# Patient Record
Sex: Female | Born: 1975
Health system: Southern US, Community
[De-identification: ages and names within clinical notes are randomized; demographics above are authoritative.]

## PROBLEM LIST (undated history)

## (undated) DIAGNOSIS — T7840XA Allergy, unspecified, initial encounter: Secondary | ICD-10-CM

## (undated) HISTORY — DX: Allergy, unspecified, initial encounter: T78.40XA

---

## 2003-12-14 ENCOUNTER — Other Ambulatory Visit: Admission: RE | Admit: 2003-12-14 | Discharge: 2003-12-14 | Payer: Self-pay | Admitting: Obstetrics & Gynecology

## 2004-04-16 ENCOUNTER — Ambulatory Visit (HOSPITAL_COMMUNITY): Admission: RE | Admit: 2004-04-16 | Discharge: 2004-04-16 | Payer: Self-pay | Admitting: *Deleted

## 2004-04-17 ENCOUNTER — Inpatient Hospital Stay (HOSPITAL_COMMUNITY): Admission: AD | Admit: 2004-04-17 | Discharge: 2004-04-17 | Payer: Self-pay | Admitting: Obstetrics and Gynecology

## 2004-04-20 ENCOUNTER — Inpatient Hospital Stay (HOSPITAL_COMMUNITY): Admission: AD | Admit: 2004-04-20 | Discharge: 2004-04-20 | Payer: Self-pay | Admitting: Obstetrics and Gynecology

## 2004-04-23 ENCOUNTER — Inpatient Hospital Stay (HOSPITAL_COMMUNITY): Admission: AD | Admit: 2004-04-23 | Discharge: 2004-04-23 | Payer: Self-pay | Admitting: Obstetrics & Gynecology

## 2004-04-30 ENCOUNTER — Inpatient Hospital Stay (HOSPITAL_COMMUNITY): Admission: AD | Admit: 2004-04-30 | Discharge: 2004-04-30 | Payer: Self-pay | Admitting: Obstetrics & Gynecology

## 2004-05-10 ENCOUNTER — Inpatient Hospital Stay (HOSPITAL_COMMUNITY): Admission: AD | Admit: 2004-05-10 | Discharge: 2004-05-10 | Payer: Self-pay | Admitting: Obstetrics & Gynecology

## 2004-05-13 ENCOUNTER — Inpatient Hospital Stay (HOSPITAL_COMMUNITY): Admission: AD | Admit: 2004-05-13 | Discharge: 2004-05-13 | Payer: Self-pay | Admitting: Obstetrics and Gynecology

## 2004-05-16 ENCOUNTER — Inpatient Hospital Stay (HOSPITAL_COMMUNITY): Admission: AD | Admit: 2004-05-16 | Discharge: 2004-05-16 | Payer: Self-pay | Admitting: *Deleted

## 2004-05-23 ENCOUNTER — Inpatient Hospital Stay (HOSPITAL_COMMUNITY): Admission: AD | Admit: 2004-05-23 | Discharge: 2004-05-23 | Payer: Self-pay | Admitting: Obstetrics and Gynecology

## 2004-05-30 ENCOUNTER — Inpatient Hospital Stay (HOSPITAL_COMMUNITY): Admission: AD | Admit: 2004-05-30 | Discharge: 2004-05-30 | Payer: Self-pay | Admitting: *Deleted

## 2004-06-06 ENCOUNTER — Inpatient Hospital Stay (HOSPITAL_COMMUNITY): Admission: AD | Admit: 2004-06-06 | Discharge: 2004-06-06 | Payer: Self-pay | Admitting: Obstetrics and Gynecology

## 2004-06-13 ENCOUNTER — Inpatient Hospital Stay (HOSPITAL_COMMUNITY): Admission: AD | Admit: 2004-06-13 | Discharge: 2004-06-13 | Payer: Self-pay | Admitting: Obstetrics and Gynecology

## 2004-07-04 ENCOUNTER — Ambulatory Visit (HOSPITAL_COMMUNITY): Admission: RE | Admit: 2004-07-04 | Discharge: 2004-07-04 | Payer: Self-pay | Admitting: *Deleted

## 2004-08-10 ENCOUNTER — Inpatient Hospital Stay (HOSPITAL_COMMUNITY): Admission: AD | Admit: 2004-08-10 | Discharge: 2004-08-10 | Payer: Self-pay | Admitting: Obstetrics and Gynecology

## 2004-08-12 ENCOUNTER — Inpatient Hospital Stay (HOSPITAL_COMMUNITY): Admission: AD | Admit: 2004-08-12 | Discharge: 2004-08-12 | Payer: Self-pay | Admitting: Obstetrics & Gynecology

## 2004-11-04 ENCOUNTER — Inpatient Hospital Stay (HOSPITAL_COMMUNITY): Admission: AD | Admit: 2004-11-04 | Discharge: 2004-11-04 | Payer: Self-pay | Admitting: Obstetrics and Gynecology

## 2005-06-08 ENCOUNTER — Inpatient Hospital Stay (HOSPITAL_COMMUNITY): Admission: AD | Admit: 2005-06-08 | Discharge: 2005-06-10 | Payer: Self-pay | Admitting: Obstetrics and Gynecology

## 2006-08-21 ENCOUNTER — Inpatient Hospital Stay (HOSPITAL_COMMUNITY): Admission: AD | Admit: 2006-08-21 | Discharge: 2006-08-23 | Payer: Self-pay | Admitting: Obstetrics and Gynecology

## 2011-01-16 ENCOUNTER — Emergency Department (HOSPITAL_BASED_OUTPATIENT_CLINIC_OR_DEPARTMENT_OTHER)
Admission: EM | Admit: 2011-01-16 | Discharge: 2011-01-16 | Disposition: A | Discharge status: Home / Self Care | Payer: BC Managed Care – PPO | Attending: Emergency Medicine | Admitting: Emergency Medicine

## 2011-01-16 ENCOUNTER — Emergency Department (INDEPENDENT_AMBULATORY_CARE_PROVIDER_SITE_OTHER): Payer: BC Managed Care – PPO

## 2011-01-16 DIAGNOSIS — R05 Cough: Secondary | ICD-10-CM

## 2011-01-16 DIAGNOSIS — Z79899 Other long term (current) drug therapy: Secondary | ICD-10-CM | POA: Insufficient documentation

## 2011-01-16 DIAGNOSIS — R059 Cough, unspecified: Secondary | ICD-10-CM | POA: Insufficient documentation

## 2011-08-01 ENCOUNTER — Ambulatory Visit: Payer: BC Managed Care – PPO | Admitting: Family Medicine

## 2011-11-06 ENCOUNTER — Ambulatory Visit: Payer: BC Managed Care – PPO | Admitting: Family Medicine

## 2011-11-10 ENCOUNTER — Ambulatory Visit (INDEPENDENT_AMBULATORY_CARE_PROVIDER_SITE_OTHER): Payer: BC Managed Care – PPO | Admitting: Family Medicine

## 2011-11-10 ENCOUNTER — Encounter: Payer: Self-pay | Admitting: Family Medicine

## 2011-11-10 DIAGNOSIS — J309 Allergic rhinitis, unspecified: Secondary | ICD-10-CM

## 2011-11-10 MED ORDER — FLUTICASONE FUROATE 27.5 MCG/SPRAY NA SUSP: 2.0000 | Freq: Every day | NASAL | Status: DC

## 2011-11-10 NOTE — Progress Notes (Signed)
  Subjective:    Patient ID: Debbie Hammond, female    DOB: September 22, 1976, 35 y.o.   MRN: [id number]  HPI New to establish.  No previous MD.  [facility] Bibles[facility] [place], Dr Seymour Bars  Allergies- chronic problem, saw allergist in 2010.  Was tested for asthma- none found.  Allergic to [facility] and grasses.  Taking Claritin daily and singulair as needed.  Has nasal spray- can't remember the name (Veramyst upon questioning)  Rarely using albuterol inhaler- last used in May.  Current cough and runny nose.  Denies feeling ill.   Review of Systems For ROS see HPI     Objective:   Physical Exam  Vitals reviewed. Constitutional: [name]. No distress.  HENT:  Head: Normocephalic and atraumatic.  Right Ear: Tympanic membrane normal.  Left Ear: Tympanic membrane normal.  Nose: Mucosal edema and rhinorrhea present. Right sinus exhibits no maxillary sinus tenderness and no frontal sinus tenderness. Left sinus exhibits no maxillary sinus tenderness and no frontal sinus tenderness.  Mouth/Throat: Mucous membranes are normal. Posterior oropharyngeal erythema (w/ PND) present.  Eyes: Conjunctivae and EOM are normal. Pupils are equal, round, and reactive to light.  Neck: Normal range of motion. Neck supple.  Cardiovascular: Normal rate, regular rhythm and normal heart sounds.   Pulmonary/Chest: Effort normal and breath sounds normal. No respiratory distress. She has no wheezes. She has no rales.  Lymphadenopathy:    She has no cervical adenopathy.          Assessment & Plan:

## 2011-11-10 NOTE — Patient Instructions (Signed)
Schedule your complete physical at your convenience- don't eat before this appt Continue the Claritin daily Add the Veramyst spray- 2 sprays each nostril daily Call with any questions or concerns Welcome!  We're glad to have you! Happy Holidays!!

## 2011-11-23 DIAGNOSIS — J309 Allergic rhinitis, unspecified: Secondary | ICD-10-CM | POA: Insufficient documentation

## 2011-11-23 NOTE — Assessment & Plan Note (Signed)
Chronic problem.  Encouraged continued use of OTC antihistamine and regular use of nasal steroid.  Reviewed supportive care and red flags that should prompt return.  Pt expressed understanding and is in agreement w/ plan.

## 2012-01-19 ENCOUNTER — Ambulatory Visit (INDEPENDENT_AMBULATORY_CARE_PROVIDER_SITE_OTHER): Payer: BC Managed Care – PPO | Admitting: Internal Medicine

## 2012-01-19 ENCOUNTER — Encounter: Payer: Self-pay | Admitting: *Deleted

## 2012-01-19 ENCOUNTER — Ambulatory Visit (INDEPENDENT_AMBULATORY_CARE_PROVIDER_SITE_OTHER)
Admission: RE | Admit: 2012-01-19 | Discharge: 2012-01-19 | Disposition: A | Discharge status: Home / Self Care | Payer: BC Managed Care – PPO | Source: Ambulatory Visit | Attending: Internal Medicine | Admitting: Internal Medicine

## 2012-01-19 VITALS — BP 102/64 | HR 105 | Temp 97.7°F | Wt 120.0 lb

## 2012-01-19 DIAGNOSIS — M549 Dorsalgia, unspecified: Secondary | ICD-10-CM

## 2012-01-19 LAB — CBC WITH DIFFERENTIAL/PLATELET
Basophils Absolute: 0 10*3/uL (ref 0.0–0.1)
HCT: 39.1 % (ref 36.0–46.0)
Hemoglobin: 13 g/dL (ref 12.0–15.0)
Lymphocytes Relative: 28.8 % (ref 12.0–46.0)
Lymphs Abs: 1.3 10*3/uL (ref 0.7–4.0)
MCV: 92.2 fl (ref 78.0–100.0)
Neutrophils Relative %: 51.3 % (ref 43.0–77.0)
RDW: 12.6 % (ref 11.5–14.6)

## 2012-01-19 LAB — SEDIMENTATION RATE: Sed Rate: 9 mm/hr (ref 0–22)

## 2012-01-19 MED ORDER — HYDROCODONE-ACETAMINOPHEN 5-500 MG PO TABS: 1.0000 | ORAL_TABLET | ORAL | Status: AC | PRN

## 2012-01-19 MED ORDER — CYCLOBENZAPRINE HCL 10 MG PO TABS: 10.0000 mg | ORAL_TABLET | Freq: Every evening | ORAL | Status: AC | PRN

## 2012-01-19 NOTE — Assessment & Plan Note (Signed)
On and off back pain for 6 months, this episode is lasting longer than usual. She is slightly tender to palpation and a distal T-spine. Plan: CBC, sed rate X-ray She is allergic to aspirin, takes Motrin from time to time but it causes GI side effects consequently will prescribe hydrocodone, Flexeril. See  instructions, if not better will need a referral

## 2012-01-19 NOTE — Progress Notes (Signed)
  Subjective:    Patient ID: Debbie Hammond, female    DOB: 1976-09-03, 35 y.o.   MRN: [id number]  HPI Acute vist 6 months history of on and off low back pain without radiation, symptoms usually last a day or 2, this episode started 2 days ago and is not improving. Pain is usually triggered by minor home chores, she does not do heavy lifting at home or at work. The pain does not radiate, she usually takes one or 2 Motrin with some help but admits that it caused some stomach discomfort.  Past Medical History  Diagnosis Date  . Allergy   . Asthma      Review of Systems No fever or chills No lower extremity paresthesias No actual injuries. No rash anywhere. No overt extremity edema. Does her birth control with an IUD    Objective:   Physical Exam  Constitutional: She is oriented to person, place, and time. [name]. No distress.  Abdominal: Soft. Bowel sounds are normal. She exhibits no distension. There is no tenderness. There is no rebound and no guarding.  Musculoskeletal: She exhibits no edema.       Legs: Neurological: [name], place, and time.       She does have an antalgic position when she lays down. Neurological exam is otherwise normal, no motor deficits, and DTRs normal. Straight leg test negative.  Skin: She is not diaphoretic.  Psychiatric: She has a normal mood and affect. Her behavior is normal. Judgment and thought content normal.       Assessment & Plan:

## 2012-01-19 NOTE — Patient Instructions (Signed)
No heavy lifting Warm compresses For pain, avoid Motrin, take Tylenol, you can also take the prescribed medications Flexeril and hydrocodone; keep in mind hydrocodone has tylenol in it already. Both meds  will make you sleepy Call if no better in 1 week Get the XR

## 2012-01-20 ENCOUNTER — Encounter: Payer: Self-pay | Admitting: Internal Medicine

## 2012-01-20 ENCOUNTER — Telehealth: Payer: Self-pay | Admitting: Family Medicine

## 2012-01-20 NOTE — Telephone Encounter (Signed)
Please advise on Xray results.

## 2012-01-20 NOTE — Telephone Encounter (Signed)
Advise patient: XR ok, plan is the same

## 2012-01-20 NOTE — Telephone Encounter (Signed)
Discuss with patient. Pt would now like to know result of labs.

## 2012-01-20 NOTE — Telephone Encounter (Signed)
Spoke with pt gave lab results

## 2012-01-20 NOTE — Telephone Encounter (Signed)
Patient is calling to get results from xray taken yesterday. Call [phone]

## 2012-01-20 NOTE — Telephone Encounter (Signed)
Labs were normal

## 2012-02-16 ENCOUNTER — Encounter: Payer: Self-pay | Admitting: Family Medicine

## 2012-02-16 ENCOUNTER — Ambulatory Visit: Payer: BC Managed Care – PPO | Admitting: Family Medicine

## 2012-02-16 ENCOUNTER — Ambulatory Visit (INDEPENDENT_AMBULATORY_CARE_PROVIDER_SITE_OTHER): Payer: BC Managed Care – PPO | Admitting: Family Medicine

## 2012-02-16 VITALS — BP 115/75 | HR 88 | Temp 98.5°F | Ht 70.25 in | Wt 122.8 lb

## 2012-02-16 DIAGNOSIS — K644 Residual hemorrhoidal skin tags: Secondary | ICD-10-CM

## 2012-02-16 DIAGNOSIS — L309 Dermatitis, unspecified: Secondary | ICD-10-CM

## 2012-02-16 DIAGNOSIS — M549 Dorsalgia, unspecified: Secondary | ICD-10-CM

## 2012-02-16 DIAGNOSIS — L259 Unspecified contact dermatitis, unspecified cause: Secondary | ICD-10-CM

## 2012-02-16 HISTORY — DX: Residual hemorrhoidal skin tags: K64.4

## 2012-02-16 MED ORDER — HYDROCORTISONE 2.5 % EX OINT: TOPICAL_OINTMENT | Freq: Two times a day (BID) | CUTANEOUS | Status: AC

## 2012-02-16 MED ORDER — PREDNISONE 20 MG PO TABS: ORAL_TABLET | ORAL | Status: DC

## 2012-02-16 NOTE — Progress Notes (Signed)
  Subjective:    Patient ID: Debbie Hammond, female    DOB: [birth year], 36 y.o.   MRN: [id number]  HPI Back pain- pt saw Dr Drue Novel on 3/4 and was treated w/ flexeril and hydrocodone.  Pt reports sxs have somewhat improved.  Stopped the meds b/c they made her sleepy.  Pain is lower back, bilateral.  Able to 'do everything' but difficulty w/ getting up from sitting or lying.  Difficulty picking up something when dropped/bending down.  Using heating pad w/ some relief.  No weakness or numbness of legs.  Pain does not radiate into legs.  Recurrent hemorrhoids- first occurred w/ 2nd pregnancy, recently recurred.  Using Tucks pads w/ some relief.  Interested in tx.  Eczema- pt w/ hyperpigmented patches on neck, occasionally itchy.  Using OTC hydrocortisone cream w/ some relief but continues to have new areas arise.  Review of Systems For ROS see HPI     Objective:   Physical Exam  Vitals reviewed. Constitutional: [name]. No distress.  Musculoskeletal: Normal range of motion. She exhibits tenderness (over bilateral SI joints). She exhibits no edema.       (-) SLR bilaterally Good flexion and extension of back  Neurological: She has normal reflexes. Coordination normal.  Skin: Skin is warm and dry. Rash (hyperpigmented eczematous areas on neck) noted.          Assessment & Plan:

## 2012-02-16 NOTE — Patient Instructions (Signed)
Start the Prednisone- 2 tabs at the same time w/ food for the back pain Continue the heating pad If no improvement in 1 week- call me and we'll refer you to Sports Medicine Someone will call you with your surgery appt for the hemorrhoid Continue the hydrocortisone cream on the eczema Call with any questions or concerns Hang in there!!!

## 2012-02-17 DIAGNOSIS — L309 Dermatitis, unspecified: Secondary | ICD-10-CM | POA: Insufficient documentation

## 2012-02-17 NOTE — Assessment & Plan Note (Signed)
New.  sxs not responding to OTC hydrocortisone cream.  Increase to 2.5% BID prn.  Pt expressed understanding and is in agreement w/ plan.

## 2012-02-17 NOTE — Assessment & Plan Note (Addendum)
New.  Pt would like referral for definitive tx due to recurrence.  Referral to surgery made.

## 2012-02-17 NOTE — Assessment & Plan Note (Signed)
Improving.  Pt w/ SI joint pain.  Had rxn to ASA so will avoid typical NSAIDs but will start Prednisone and see if sxs improve.  If not, will refer to ortho.  Reviewed supportive care and red flags that should prompt return.  Pt expressed understanding and is in agreement w/ plan.

## 2012-03-08 ENCOUNTER — Ambulatory Visit (INDEPENDENT_AMBULATORY_CARE_PROVIDER_SITE_OTHER): Payer: BC Managed Care – PPO | Admitting: General Surgery

## 2012-03-16 ENCOUNTER — Telehealth: Payer: Self-pay | Admitting: Family Medicine

## 2012-03-16 NOTE — Telephone Encounter (Signed)
Patient called back and now wants to know if she can have the IUD she has removed and another one inserted? She still has the paperwork from the original IUD and states she can bring it with her. Should she go back to her GYN that originally put it in or can she keep her appointment on 5.20.13 and you do both? Please advise  Thanks

## 2012-03-16 NOTE — Telephone Encounter (Signed)
Pt can have IUD removed at time of CPE/pap but there would be no time to address additional concerns at the physical.  If she would like to come in prior to 5/22 for IUD removal it could be squeezed into a 15 minute slot but that would be the only thing done that day.

## 2012-03-16 NOTE — Telephone Encounter (Signed)
Will need to see GYN for IUD insertion (we order these on an individual basis and do not have one in stock).  Based on fact she wants another IUD placed, she should go to GYN for removal and subsequent insertion of replacement

## 2012-03-16 NOTE — Telephone Encounter (Signed)
Patient states she can not miss to much work for appointments and she needs to work to pay for her insurance and her co-pay. She is requesting a physical along with her IUD removal. I tried to explain to her both take at least 30-minutes and I did not have time on the schedule to do both in May. I did schedule her for a 30-minute OV 5.20.13 for her IUD removal and explained to her that was all I could at this time but I would speak with the Doctor about it and advise later today what we could or could not do. Patient states her IUD must be out before 5.22.13 and she does not want to wait any longer than that and that she doesn't want to come back and pay another co-pay or miss work for a Physical. Please review and advise Patient PH# [phone]

## 2012-03-16 NOTE — Telephone Encounter (Signed)
Advised patient via phone, she clearly understood & has decided to make them separate appointments.

## 2012-03-16 NOTE — Telephone Encounter (Signed)
Spoke with MD Beverely Low concerning the IUD and CPE, pt can have this apt combined but not be able to discuss another other concerns curing the CPE per time to do both will not allow for other discussion, MD Beverely Low advised that removal of the IUD after 04-07-12 is not crucial, this just means the IUD will no longer protect against pregnancy, please advise pt

## 2012-03-17 NOTE — Telephone Encounter (Signed)
Spoke to patient she understood completely was able to get appointment with GYN & was happy

## 2012-03-17 NOTE — Telephone Encounter (Signed)
Great!

## 2012-04-05 ENCOUNTER — Ambulatory Visit: Payer: BC Managed Care – PPO | Admitting: Family Medicine

## 2012-04-26 ENCOUNTER — Telehealth: Payer: Self-pay | Admitting: Family Medicine

## 2012-04-26 DIAGNOSIS — L309 Dermatitis, unspecified: Secondary | ICD-10-CM

## 2012-04-26 NOTE — Telephone Encounter (Addendum)
Called pt to clarify where the rash is and how it looks, etc per noted eczema from the 4-13 OV, in order to place referral in chart need to have clarified, unable to leave message on home phone, will call again later, called pt and was advised yes the same rash noted as eczema, advised that a referral has been sent and someone will call her with the apt once it is available.

## 2012-04-26 NOTE — Telephone Encounter (Signed)
Pt states her rash has spread and would like Dr. Beverely Low to refer her to a dermatologist. Pt states she does not want to come back into the office to see Dr. Beverely Low about the same thing.

## 2014-08-17 LAB — HM MAMMOGRAPHY

## 2015-10-22 ENCOUNTER — Telehealth: Payer: Self-pay | Admitting: Behavioral Health

## 2015-10-22 ENCOUNTER — Encounter: Payer: Self-pay | Admitting: Behavioral Health

## 2015-10-22 NOTE — Telephone Encounter (Signed)
Pre-Visit Call completed with patient and chart updated.   Pre-Visit Info documented in Specialty Comments under [employer].    

## 2015-10-23 ENCOUNTER — Encounter: Payer: Self-pay | Admitting: Family

## 2015-10-23 ENCOUNTER — Ambulatory Visit (INDEPENDENT_AMBULATORY_CARE_PROVIDER_SITE_OTHER): Payer: BLUE CROSS/BLUE SHIELD | Admitting: Family

## 2015-10-23 ENCOUNTER — Other Ambulatory Visit (HOSPITAL_COMMUNITY)
Admission: RE | Admit: 2015-10-23 | Discharge: 2015-10-23 | Disposition: A | Discharge status: Home / Self Care | Payer: BLUE CROSS/BLUE SHIELD | Source: Ambulatory Visit | Attending: Family | Admitting: Family

## 2015-10-23 VITALS — BP 113/71 | HR 72 | Temp 98.4°F | Resp 16 | Ht 69.5 in | Wt 124.0 lb

## 2015-10-23 DIAGNOSIS — M25551 Pain in right hip: Secondary | ICD-10-CM | POA: Diagnosis not present

## 2015-10-23 DIAGNOSIS — Z113 Encounter for screening for infections with a predominantly sexual mode of transmission: Secondary | ICD-10-CM | POA: Insufficient documentation

## 2015-10-23 DIAGNOSIS — Z23 Encounter for immunization: Secondary | ICD-10-CM

## 2015-10-23 DIAGNOSIS — J309 Allergic rhinitis, unspecified: Secondary | ICD-10-CM

## 2015-10-23 DIAGNOSIS — R0789 Other chest pain: Secondary | ICD-10-CM

## 2015-10-23 DIAGNOSIS — N644 Mastodynia: Secondary | ICD-10-CM | POA: Diagnosis not present

## 2015-10-23 LAB — POCT URINALYSIS DIPSTICK
BILIRUBIN UA: NEGATIVE
GLUCOSE UA: NEGATIVE
Ketones, UA: NEGATIVE
Leukocytes, UA: NEGATIVE
Nitrite, UA: NEGATIVE
RBC UA: NEGATIVE
SPEC GRAV UA: 1.02
Urobilinogen, UA: NEGATIVE
pH, UA: 6.5

## 2015-10-23 NOTE — Patient Instructions (Signed)
Try to avoid/limit caffeine intake. You will be contacted about scheduling your ultrasound. You may use tylenol or motrin as needed (sparingly) for left breast/shoulder pain. Please schedule complete physical at your convenience.

## 2015-10-23 NOTE — Assessment & Plan Note (Signed)
Stable

## 2015-10-23 NOTE — Progress Notes (Signed)
Subjective:    Patient ID: Debbie Hammond, female    DOB: 07-25-1976, 39 y.o.   MRN: [id number]  HPI  Debbie Hammond is a 39 yr old female who presents today in transfer from Dr. Beverely Low.  She has 2 concerns today:  1) Intermittent left breast tenderness.  Feels like she has felt a tender "bump" in her left breast sometimes but then it goes away. Reports pain is reproducible.  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  Some days no pain or discomfort. Denies sob.  Denies pain today. Pain sometimes noted in the left shoulder.    2) R lower abdominal pain x 1 year- She reports that she does have an IUD.  Saw GYN and GYN told her IUD is in place. She notes that pain is worst during her menstrual cycle.    3) Asthma/Allergies- reports well control.  Not using singulair- uses claritin over other day.  Does not need albuterol.   Review of Systems  Constitutional: Negative for unexpected weight change.  HENT: Negative for hearing loss and rhinorrhea.   Eyes:       Wears glasses  Respiratory: Negative for cough.   Cardiovascular: Negative for leg swelling.  Gastrointestinal: Negative for diarrhea.       Occasional constipation  Genitourinary: Negative for dysuria, frequency and menstrual problem.  Musculoskeletal: Negative for myalgias and arthralgias.       Intermittent low back pain after lifting  Skin: Negative for rash.  Neurological: Negative for headaches.  Hematological: Negative for adenopathy.  Psychiatric/Behavioral:       Denies depression/anxiety   Past Medical History  Diagnosis Date  . Allergy   . Asthma     Social History   Social History  . Marital Status: Single    Spouse Name: N/A  . Number of Children: N/A  . Years of Education: N/A   Occupational History  . Not on file.   Social History Main Topics  . Smoking status: Never Smoker   . Smokeless tobacco: Never Used  . Alcohol Use: No  . Drug Use: No  . Sexual Activity: Not on  file   Other Topics Concern  . Not on file   Social History Narrative   Works   Daughter 2006   Son 2007   Works in a Pharmacologist- analyst   Married   Completed bachelors   No pets   Enjoys watching movies   Grew up in [place]- moved age 75 (after she got married)    History reviewed. No pertinent past surgical history.  History reviewed. No pertinent family history.  Allergies  Allergen Reactions  . Aspirin Swelling    Facial swelling    Current Outpatient Prescriptions on File Prior to Visit  Medication Sig Dispense Refill  . loratadine (CLARITIN) 10 MG tablet Take 10 mg by mouth daily.       No current facility-administered medications on file prior to visit.    BP 113/71 mmHg  Pulse 72  Temp(Src) 98.4 F (36.9 C) (Oral)  Resp 16  Ht 5' 9.5" (1.765 m)  Wt 124 lb (56.246 kg)  BMI 18.06 kg/m2  SpO2 100%       Objective:   Physical Exam  Constitutional: She is oriented to person, place, and time. [name].  HENT:  Head: Normocephalic and atraumatic.  Cardiovascular: Normal rate, regular rhythm and normal heart sounds.   No murmur heard. Pulmonary/Chest: Effort  normal and breath sounds normal. No respiratory distress. She has no wheezes.  Abdominal: Soft. She exhibits no distension. There is no tenderness.  Mild right lower abdominal tenderness without guarding.    Musculoskeletal: She exhibits no edema.  Neurological: [name], place, and time.  Psychiatric: She has a normal mood and affect. Her behavior is normal. Judgment and thought content normal.          Assessment & Plan:    Mild right sided pelvic pain- will obtain UA/culture, pelvic US, suspect ovarian cyst.    Mastalgia- EKG tracing is personally reviewed.  EKG notes NSR.  No acute changes. I think that her pain may be due to musculoskeletal pain and possible some fibrocystic breast changes with her cycle. Not noted  today on exam.  We did discuss cutting down caffeine,

## 2015-10-23 NOTE — Progress Notes (Signed)
Pre visit review using our clinic review tool, if applicable. No additional management support is needed unless otherwise documented below in the visit note. 

## 2015-10-24 LAB — URINE CYTOLOGY ANCILLARY ONLY
Chlamydia: NEGATIVE
Neisseria Gonorrhea: NEGATIVE

## 2015-10-25 LAB — URINE CULTURE
Colony Count: NO GROWTH
Organism ID, Bacteria: NO GROWTH

## 2015-11-08 ENCOUNTER — Telehealth: Payer: Self-pay | Admitting: Family

## 2015-11-08 ENCOUNTER — Ambulatory Visit (HOSPITAL_BASED_OUTPATIENT_CLINIC_OR_DEPARTMENT_OTHER)
Admission: RE | Admit: 2015-11-08 | Discharge: 2015-11-08 | Disposition: A | Discharge status: Home / Self Care | Payer: BLUE CROSS/BLUE SHIELD | Source: Ambulatory Visit | Attending: Family | Admitting: Family

## 2015-11-08 DIAGNOSIS — R10A0 Flank pain, unspecified side: Secondary | ICD-10-CM

## 2015-11-08 DIAGNOSIS — M25551 Pain in right hip: Secondary | ICD-10-CM | POA: Insufficient documentation

## 2015-11-08 DIAGNOSIS — R1031 Right lower quadrant pain: Secondary | ICD-10-CM | POA: Insufficient documentation

## 2015-11-08 DIAGNOSIS — R109 Unspecified abdominal pain: Secondary | ICD-10-CM

## 2015-11-08 DIAGNOSIS — N83202 Unspecified ovarian cyst, left side: Secondary | ICD-10-CM

## 2015-11-08 DIAGNOSIS — R102 Pelvic and perineal pain: Secondary | ICD-10-CM | POA: Diagnosis not present

## 2015-11-08 NOTE — Telephone Encounter (Signed)
Ultrasound is normal on the right.  US shows probable cyst on the left. They are recommending follow up ultrasound in 4-6 weeks. If right sided pain does not improve, please follow back up in the office with PCP. f

## 2015-11-09 ENCOUNTER — Other Ambulatory Visit: Payer: Self-pay | Admitting: Family

## 2015-11-09 ENCOUNTER — Telehealth: Payer: Self-pay | Admitting: Family

## 2015-11-09 DIAGNOSIS — R109 Unspecified abdominal pain: Secondary | ICD-10-CM

## 2015-11-09 DIAGNOSIS — N83202 Unspecified ovarian cyst, left side: Secondary | ICD-10-CM

## 2015-11-09 NOTE — Telephone Encounter (Signed)
Left detailed message on home# and to call if any questions. 

## 2015-11-09 NOTE — Addendum Note (Signed)
Addended by: Sandford Craze'SULLIVAN, Izola Teague on: 11/09/2015 01:19 PM   Modules accepted: Orders

## 2015-11-09 NOTE — Telephone Encounter (Signed)
Pt called and was notified of below information. Pt was concerned about additional costs and workup as she states she has had this pain come and go for 5 years and previous workup hasn't  revealed a cause. Pt wanted to know what else could be done?  Per verbal from NP, Peggyann JubaO'Sullivan will order CT to rule out kidney stone. Notified pt and she is agreeable to proceed with CT.  Please see pended order?

## 2015-11-09 NOTE — Addendum Note (Signed)
Addended by: Mervin KungFERGERSON, Sophronia Varney A on: 11/09/2015 10:16 AM   Modules accepted: Orders

## 2015-11-09 NOTE — Telephone Encounter (Signed)
See order.

## 2015-11-09 NOTE — Addendum Note (Signed)
Addended by: Sandford Craze'SULLIVAN, Chenika Nevils on: 11/09/2015 10:27 AM   Modules accepted: Orders

## 2015-11-14 ENCOUNTER — Ambulatory Visit (HOSPITAL_BASED_OUTPATIENT_CLINIC_OR_DEPARTMENT_OTHER)
Admission: RE | Admit: 2015-11-14 | Discharge: 2015-11-14 | Disposition: A | Discharge status: Home / Self Care | Payer: BLUE CROSS/BLUE SHIELD | Source: Ambulatory Visit | Attending: Family | Admitting: Family

## 2015-11-14 DIAGNOSIS — M545 Low back pain: Secondary | ICD-10-CM | POA: Insufficient documentation

## 2015-11-14 DIAGNOSIS — R109 Unspecified abdominal pain: Secondary | ICD-10-CM | POA: Insufficient documentation

## 2015-11-16 ENCOUNTER — Encounter: Payer: Self-pay | Admitting: General Practice

## 2015-11-26 ENCOUNTER — Telehealth: Payer: Self-pay | Admitting: Family Medicine

## 2015-11-26 NOTE — Telephone Encounter (Signed)
Pt called regarding CT results stating the problem is continuing and she needs advice on what to do. Please call back at [phone].

## 2015-11-28 NOTE — Telephone Encounter (Signed)
I would recommend that she schedule follow up in our office for re-evaluation since symptoms persist and CT was normal.

## 2015-11-28 NOTE — Telephone Encounter (Signed)
Left detailed message on cell# re: below recommendation and to call and schedule appt for further workup.

## 2015-11-28 NOTE — Telephone Encounter (Signed)
I am forwarding this to [facility] as she was the treating provider and I am not familiar w/ her current situation

## 2015-11-30 NOTE — Telephone Encounter (Signed)
Left message on cell# to verify that she received below message.

## 2016-10-20 LAB — HM PAP SMEAR: HM PAP: NORMAL

## 2017-07-01 ENCOUNTER — Ambulatory Visit (HOSPITAL_BASED_OUTPATIENT_CLINIC_OR_DEPARTMENT_OTHER)
Admission: RE | Admit: 2017-07-01 | Discharge: 2017-07-01 | Disposition: A | Discharge status: Home / Self Care | Payer: BLUE CROSS/BLUE SHIELD | Source: Ambulatory Visit | Attending: Medical | Admitting: Medical

## 2017-07-01 ENCOUNTER — Encounter: Payer: Self-pay | Admitting: Medical

## 2017-07-01 ENCOUNTER — Telehealth: Payer: Self-pay | Admitting: Medical

## 2017-07-01 ENCOUNTER — Ambulatory Visit (INDEPENDENT_AMBULATORY_CARE_PROVIDER_SITE_OTHER): Payer: BLUE CROSS/BLUE SHIELD | Admitting: Medical

## 2017-07-01 ENCOUNTER — Other Ambulatory Visit: Payer: Self-pay | Admitting: Medical

## 2017-07-01 VITALS — BP 132/75 | HR 80 | Temp 98.7°F | Resp 16 | Ht 70.0 in | Wt 130.8 lb

## 2017-07-01 DIAGNOSIS — M898X5 Other specified disorders of bone, thigh: Secondary | ICD-10-CM

## 2017-07-01 DIAGNOSIS — M79604 Pain in right leg: Secondary | ICD-10-CM | POA: Insufficient documentation

## 2017-07-01 DIAGNOSIS — M899 Disorder of bone, unspecified: Secondary | ICD-10-CM | POA: Insufficient documentation

## 2017-07-01 NOTE — Patient Instructions (Addendum)
For thigh pain will rx ibuprophen. Can apply warm compresses to thigh area twice daily  Will get xray of hip and thigh/femur area.  Also will get rt lower ext US of area to assess muscles, soft tissue and veins.  Further management may change depending on what is found on studies and if  sign/symptom change  Will send note to your pcp as well.  Will get cbc today to check wbc.  Follow up 7 days or as needed

## 2017-07-01 NOTE — Telephone Encounter (Signed)
Melissa,   I sent you visit note on pt of yours. Work up so far is negative. Negative xrays and US. The presentation and history is a bit atypical. Wanted you to be aware and wondering if you could see her in a week? If you want me to add or think of other potential appropitate study at this point let me know and can order.  Thanks. Ramon DredgeEdward

## 2017-07-01 NOTE — Progress Notes (Signed)
Subjective:    Patient ID: Debbie MillardRuth S Hammond, female    DOB: 1975/11/21, 41 y.o.   MRN: [id number]  HPI  Pt in with some rt side thigh pain. Pain is mild. This has been going on for 6 weeks. Pain minimal at first now pain is worse. Pain intermittent. Mostly when pant rubs again her leg.  Pt states pain felt like nerve pain. Pt denies storing anything in her rt pocket rubbing that rubs against her thighs. Pt states skin looks normal to color. No warmth.  Pt has varicose veins medial rt thigh and mid thigh but she has never seen varicose vein lateral aspect.   Pt states she has slight sunken in appearance to her lateral thigh. She noticed this past week.  No report of fever, chills or sweats. No trauma to leg no association with activity.  Pt has nexplanon. Spots only.  Review of Systems  Constitutional: Negative for chills, fatigue and fever.  Respiratory: Negative for cough, chest tightness, shortness of breath and wheezing.   Cardiovascular: Negative for chest pain and palpitations.  Gastrointestinal: Negative for abdominal pain.  Musculoskeletal: Negative for back pain.       Lateral aspect of her thigh slight tender. See hpi.  Neurological: Negative for dizziness.  Hematological: Negative for adenopathy. Does not bruise/bleed easily.  Psychiatric/Behavioral: Negative for behavioral problems, confusion, sleep disturbance and suicidal ideas. The patient is not nervous/anxious.    Past Medical History:  Diagnosis Date  . Allergy   . Asthma      Social History   Social History  . Marital status: Single    Spouse name: N/A  . Number of children: N/A  . Years of education: N/A   Occupational History  . Not on file.   Social History Main Topics  . Smoking status: Never Smoker  . Smokeless tobacco: Never Used  . Alcohol use No  . Drug use: No  . Sexual activity: Not on file   Other Topics Concern  . Not on file   Social History Narrative   Works   Daughter 2006   Son  2007   Works in a Pharmacologistpharmaceutical lab- analyst   Married   Completed bachelors   No pets   Enjoys watching movies   [place] 41 [place]after she got married[place]    No past surgical history on file.  No family history on file.  Allergies  Allergen Reactions  . Aspirin Swelling    Facial swelling    Current Outpatient Prescriptions on File Prior to Visit  Medication Sig Dispense Refill  . loratadine (CLARITIN) 10 MG tablet Take 10 mg by mouth daily.       No current facility-administered medications on file prior to visit.     BP 132/75   Pulse 80   Temp 98.7 F (37.1 C) (Oral)   Resp 16   Ht 5\' 10"  (1.778 m)   Wt 130 lb 12.8 oz (59.3 kg)   SpO2 100%   BMI 18.77 kg/m       Objective:   Physical Exam  General- No acute distress. Pleasant patient.  Lungs- Clear, even and unlabored. Heart- regular rate and rhythm. Neurologic- CNII- XII grossly intact.  Rt leg/thigh- mild lateral aspect thigh tenderness mid portion to proximal aspect. Upper 1/3 dose have small shallow indented area(but only obvious on palpation not inspection alone). Approximate size 2 cm x 1 cm. Depth of area 0.5 cm. No induration. No warmth.  No rash, no vesicle or fluctuant feel.  Mid upper thigh scattered small varicose veins.  The area in question is mildly tender to palpation.  Rt hip good rom.      Assessment & Plan:  For thigh pain will rx ibuprofen(per pt has used in past with no side effect or intolerance). Can apply warm compresses to thigh area.  Will get xray of hip and thigh/femur area.  Also will get rt lower ext [place] of area to assess muscles, soft tissue and veins.  Further management may change depending on what is found on studies and if sign/symptom change  Will send note to your pcp as well.  Will get cbc today to check wbc.  Follow up 7 days or as needed

## 2017-07-02 ENCOUNTER — Telehealth: Payer: Self-pay | Admitting: Family

## 2017-07-02 LAB — CBC WITH DIFFERENTIAL/PLATELET
BASOS PCT: 1 % (ref 0.0–3.0)
Basophils Absolute: 0.1 10*3/uL (ref 0.0–0.1)
EOS ABS: 0.1 10*3/uL (ref 0.0–0.7)
Eosinophils Relative: 2.4 % (ref 0.0–5.0)
HEMATOCRIT: 41.3 % (ref 36.0–46.0)
HEMOGLOBIN: 13.5 g/dL (ref 12.0–15.0)
LYMPHS ABS: 1.6 10*3/uL (ref 0.7–4.0)
LYMPHS PCT: 29.5 % (ref 12.0–46.0)
MCHC: 32.8 g/dL (ref 30.0–36.0)
MCV: 92.8 fl (ref 78.0–100.0)
Monocytes Absolute: 0.9 10*3/uL (ref 0.1–1.0)
Monocytes Relative: 17.1 % — ABNORMAL HIGH (ref 3.0–12.0)
NEUTROS ABS: 2.7 10*3/uL (ref 1.4–7.7)
Neutrophils Relative %: 50 % (ref 43.0–77.0)
Platelets: 227 10*3/uL (ref 150.0–400.0)
RBC: 4.45 Mil/uL (ref 3.87–5.11)
RDW: 12.7 % (ref 11.5–15.5)
WBC: 5.4 10*3/uL (ref 4.0–10.5)

## 2017-07-02 NOTE — Telephone Encounter (Signed)
Sure- thanks.  Debbie Hammond, can you please make sure she has a follow up scheduled with me?  Thanks.

## 2017-07-02 NOTE — Telephone Encounter (Signed)
Returning assistant call for lab results.

## 2017-07-03 NOTE — Telephone Encounter (Signed)
Left message for pt to return my call. Also see phone note about imaging results.

## 2017-07-03 NOTE — Telephone Encounter (Signed)
Patient checking on the status of imaging results, please advise

## 2017-07-03 NOTE — Telephone Encounter (Signed)
Relation to YV:OPFY Call back [phone] Pharmacy:  Reason for call:  Patient calling back checking on the status of lab results, patient upset no one called her back yesterday

## 2017-07-03 NOTE — Telephone Encounter (Signed)
Left message for pt to return my call. See additional phone note re: lab results.

## 2017-07-06 NOTE — Telephone Encounter (Signed)
Noted and agree. 

## 2017-07-06 NOTE — Telephone Encounter (Signed)
Pt returning call

## 2017-07-06 NOTE — Telephone Encounter (Signed)
See additional phone note re: labs / xrays.

## 2017-07-06 NOTE — Telephone Encounter (Signed)
Spoke with pt. She has been frustrated that she has difficulty getting through to the office on the phone. Reviewed normal xray / u/s and lab results with pt. Pt reports that she continues to have side and leg pain. States ibuprofen helps leg pain but returns if she stops medication and pt states she doesn't want to be dependant on medication for her pain and wants to know what is causing pain. Advised pt of need for further evaluation in the office. No available openings for pt in the office with PCP this week. Pt is agreeable to follow back up with PA, Saguier. Scheduled appt for Wednesday at 2:45pm.

## 2017-07-08 ENCOUNTER — Ambulatory Visit: Payer: BLUE CROSS/BLUE SHIELD | Admitting: Medical

## 2017-07-17 ENCOUNTER — Telehealth: Payer: Self-pay | Admitting: *Deleted

## 2017-07-17 ENCOUNTER — Ambulatory Visit (INDEPENDENT_AMBULATORY_CARE_PROVIDER_SITE_OTHER): Payer: BLUE CROSS/BLUE SHIELD | Admitting: Family

## 2017-07-17 ENCOUNTER — Encounter: Payer: Self-pay | Admitting: Family

## 2017-07-17 VITALS — BP 128/76 | HR 83 | Temp 99.5°F | Resp 16 | Ht 70.0 in | Wt 128.8 lb

## 2017-07-17 DIAGNOSIS — M5416 Radiculopathy, lumbar region: Secondary | ICD-10-CM | POA: Diagnosis not present

## 2017-07-17 DIAGNOSIS — Z653 Problems related to other legal circumstances: Secondary | ICD-10-CM

## 2017-07-17 NOTE — Telephone Encounter (Signed)
Pt states she had pap smear with Debbie Hammond before she left [facility] in May 2018. Called and left message on voicemail at [phone] to fax result to my attn at nurse station fax. Awaiting result.

## 2017-07-17 NOTE — Progress Notes (Signed)
   Subjective:    Patient ID: Debbie Hammond, female    DOB: 1976/05/23, 41 y.o.   MRN: [id number]  HPI  Debbie Hammond is a 41 yr old female who presents today with chief complaint of right sided leg pain. Debbie Hammond reports that her right leg affects the right lateral thigh and is sometimes like a tingling or sensitivity.  Pain overall has improved. Debbie Hammond denies current pain. Debbie Hammond reports chronic history of low back pain. This pain is intermittent. Her primary concern today is that there is some indentation of her right lateral thigh.   Review of Systems See HPI  Past Medical History:  Diagnosis Date  . Allergy   . Asthma      Social History   Social History  . Marital status: Married    Spouse name: N/A  . Number of children: N/A  . Years of education: N/A   Occupational History  . Not on file.   Social History Main Topics  . Smoking status: Never Smoker  . Smokeless tobacco: Never Used  . Alcohol use No  . Drug use: No  . Sexual activity: Not on file   Other Topics Concern  . Not on file   Social History Narrative   Works   Daughter 2006   Son 2007   Works in a Pharmacologistpharmaceutical lab- analyst   Married   Completed bachelors   No pets   Enjoys watching movies   Grew up in [place]- moved age 41 (after Debbie Hammond got married)    No past surgical history on file.  No family history on file.  Allergies  Allergen Reactions  . Aspirin Swelling    Facial swelling    Current Outpatient Prescriptions on File Prior to Visit  Medication Sig Dispense Refill  . loratadine (CLARITIN) 10 MG tablet Take 10 mg by mouth daily.       No current facility-administered medications on file prior to visit.     BP 128/76 (BP Location: Right Arm, Cuff Size: Normal)   Pulse 83   Temp 99.5 F (37.5 C) (Oral)   Resp 16   Ht 5\' 10"  (1.778 m)   Wt 128 lb 12.8 oz (58.4 kg)   LMP 07/01/2017   SpO2 100%   BMI 18.48 kg/m       Objective:   Physical Exam  Constitutional: Debbie Hammond appears  well-developed and well-nourished.  Cardiovascular: Normal rate, regular rhythm and normal heart sounds.   No murmur heard. Pulmonary/Chest: Effort normal and breath sounds normal. No respiratory distress. Debbie Hammond has no wheezes.  Musculoskeletal:  Slight indentation of right lateral thigh. No induration no tenderness.  Psychiatric: Debbie Hammond has a normal mood and affect. Her behavior is normal. Judgment and thought content normal.          Assessment & Plan:  Lumbar radiculopathy-I suspect that her intermittent leg pain is due to some mild lumbar radiculopathy. Debbie Hammond is not currently having pain. Debbie Hammond is concerned about the dimple on her right lateral thigh. Tissue feels normal to me there is no sign of mass or of infection. Reassurance provided.

## 2017-07-17 NOTE — Patient Instructions (Signed)
Please call if increased pain/swelling.  Schedule a complete physical.

## 2017-07-21 NOTE — Telephone Encounter (Signed)
Do not see that we have received pap smear result yet. Left 2nd message on medical record voicemail at below office requesting result be faxed to my attn at the nurses station. Awaiting result.

## 2017-07-21 NOTE — Telephone Encounter (Signed)
Result received, abstracted and placed in PCP's yellow folder for review.

## 2017-07-24 ENCOUNTER — Telehealth: Payer: Self-pay | Admitting: Family

## 2017-07-24 ENCOUNTER — Ambulatory Visit (INDEPENDENT_AMBULATORY_CARE_PROVIDER_SITE_OTHER): Payer: BLUE CROSS/BLUE SHIELD | Admitting: Family

## 2017-07-24 ENCOUNTER — Encounter: Payer: Self-pay | Admitting: Family

## 2017-07-24 VITALS — BP 120/64 | HR 95 | Temp 98.6°F | Resp 16 | Ht 70.0 in | Wt 131.2 lb

## 2017-07-24 DIAGNOSIS — M545 Low back pain, unspecified: Secondary | ICD-10-CM

## 2017-07-24 DIAGNOSIS — K5900 Constipation, unspecified: Secondary | ICD-10-CM | POA: Diagnosis not present

## 2017-07-24 DIAGNOSIS — M5440 Lumbago with sciatica, unspecified side: Secondary | ICD-10-CM | POA: Diagnosis not present

## 2017-07-24 MED ORDER — CYCLOBENZAPRINE HCL 5 MG PO TABS: 5.0000 mg | ORAL_TABLET | Freq: Three times a day (TID) | ORAL | 0 refills | Status: DC | PRN

## 2017-07-24 MED ORDER — METHYLPREDNISOLONE 4 MG PO TBPK: ORAL_TABLET | ORAL | 0 refills | Status: DC

## 2017-07-24 NOTE — Telephone Encounter (Signed)
error 

## 2017-07-24 NOTE — Progress Notes (Signed)
Subjective:    Patient ID: Debbie Hammond, female    DOB: 09-Nov-1976, 41 y.o.   MRN: [id number]  HPI  Debbie Hammond is a 41 yr old female who presents today with chief complaint of low back pain. Reports pain radiates into bilateral   lateral legs. Denies current numbness.Reports that pain began on Sunday. Using ibuprofen and naproxen without relief.  Using heating pad. Reports low back pain across the middle of her back. Denies leg weakness. Denies associated bowel/bladder incontinence.  Last BM was on Monday.    Review of Systems See HPI  Past Medical History:  Diagnosis Date  . Allergy   . Asthma      Social History   Social History  . Marital status: Married    Spouse name: N/A  . Number of children: N/A  . Years of education: N/A   Occupational History  . Not on file.   Social History Main Topics  . Smoking status: Never Smoker  . Smokeless tobacco: Never Used  . Alcohol use No  . Drug use: No  . Sexual activity: Not on file   Other Topics Concern  . Not on file   Social History Narrative   Works   Daughter 2006   Son 2007   Works in a Pharmacologist- analyst   Married   Completed bachelors   No pets   Enjoys watching movies   Grew up in [place]- moved age 2 (after she got married)    No past surgical history on file.  No family history on file.  Allergies  Allergen Reactions  . Aspirin Swelling    Facial swelling    Current Outpatient Prescriptions on File Prior to Visit  Medication Sig Dispense Refill  . Etonogestrel (NEXPLANON [place]) Inject into the skin.    Marland Kitchen loratadine (CLARITIN) 10 MG tablet Take 10 mg by mouth daily.       No current facility-administered medications on file prior to visit.     BP 120/64 (BP Location: Right Arm, Cuff Size: Normal)   Pulse 95   Temp 98.6 F (37 C) (Oral)   Resp 16   Ht  (1.778 m)   Wt 131 lb 3.2 oz (59.5 kg)   LMP 07/01/2017   SpO2 100%   BMI 18.83 kg/m       Objective:   Physical  Exam  Constitutional: She is oriented to person, place, and time. [name].  Appears uncomfortable.  Cardiovascular: Normal rate, regular rhythm and normal heart sounds.   No murmur heard. Pulmonary/Chest: Effort normal and breath sounds normal. No respiratory distress. She has no wheezes.  Musculoskeletal:       Cervical back: She exhibits no tenderness.       Lumbar back: She exhibits no tenderness.  Scoliosis noted.   Neurological: [name], place, and time.  Reflex Scores:      Patellar reflexes are 2+ on the right side and 2+ on the left side. Bilateral lower extremity strength is 5 out of 5  Positive straight leg raise right greater than left.  Psychiatric: She has a normal mood and affect. Her behavior is normal. Judgment and thought content normal.          Assessment & Plan:  Lumbar strain with radiculopathy-will prescribe Medrol Dosepak and as needed Flexeril. She is advised not to drive after taking Flexeril due to risk of drowsiness. She may continue heating pad  as needed. She is advised to call if new or worsening symptoms or symptoms are not improved in 1 week and to go to the ER if she develops bowel or bladder incontinence or leg weakness.  Constipation-patient is advised as follows: Please take a capful of miralax tonight in 8 oz of juice/water.   In no BM in 24 hours, you may repeat miralax dose.

## 2017-07-24 NOTE — Patient Instructions (Addendum)
Please take a capful of miralax tonight in 8 oz of juice/water.   In no BM in 24 hours, you may repeat miralax dose.    Begin medrol dose pak. You may use flexeril as needed for back spasm (may cause drowsiness so don't drive after taking. Call if symptoms new/worsening symptoms or if not improved in 1 week.  Go to ER if you develop bowel/bladder incontinence or leg weakness.

## 2018-06-22 ENCOUNTER — Encounter (HOSPITAL_BASED_OUTPATIENT_CLINIC_OR_DEPARTMENT_OTHER): Payer: Self-pay | Admitting: Emergency Medicine

## 2018-06-22 ENCOUNTER — Telehealth: Payer: Self-pay | Admitting: Family

## 2018-06-22 ENCOUNTER — Telehealth (HOSPITAL_COMMUNITY): Payer: Self-pay | Admitting: Emergency Medicine

## 2018-06-22 ENCOUNTER — Telehealth: Payer: Self-pay

## 2018-06-22 ENCOUNTER — Other Ambulatory Visit: Payer: Self-pay

## 2018-06-22 ENCOUNTER — Emergency Department (HOSPITAL_BASED_OUTPATIENT_CLINIC_OR_DEPARTMENT_OTHER): Payer: BLUE CROSS/BLUE SHIELD

## 2018-06-22 ENCOUNTER — Emergency Department (HOSPITAL_BASED_OUTPATIENT_CLINIC_OR_DEPARTMENT_OTHER)
Admission: EM | Admit: 2018-06-22 | Discharge: 2018-06-22 | Disposition: A | Discharge status: Home / Self Care | Payer: BLUE CROSS/BLUE SHIELD | Attending: Emergency Medicine | Admitting: Emergency Medicine

## 2018-06-22 DIAGNOSIS — J45909 Unspecified asthma, uncomplicated: Secondary | ICD-10-CM | POA: Diagnosis not present

## 2018-06-22 DIAGNOSIS — R1084 Generalized abdominal pain: Secondary | ICD-10-CM | POA: Diagnosis not present

## 2018-06-22 DIAGNOSIS — R197 Diarrhea, unspecified: Secondary | ICD-10-CM | POA: Diagnosis not present

## 2018-06-22 DIAGNOSIS — Z79899 Other long term (current) drug therapy: Secondary | ICD-10-CM | POA: Insufficient documentation

## 2018-06-22 DIAGNOSIS — E876 Hypokalemia: Secondary | ICD-10-CM

## 2018-06-22 DIAGNOSIS — K921 Melena: Secondary | ICD-10-CM | POA: Diagnosis not present

## 2018-06-22 DIAGNOSIS — R195 Other fecal abnormalities: Secondary | ICD-10-CM | POA: Insufficient documentation

## 2018-06-22 LAB — GASTROINTESTINAL PANEL BY PCR, STOOL (REPLACES STOOL CULTURE)
ASTROVIRUS: NOT DETECTED
Adenovirus F40/41: NOT DETECTED
CYCLOSPORA CAYETANENSIS: NOT DETECTED
Campylobacter species: NOT DETECTED
Cryptosporidium: NOT DETECTED
ENTAMOEBA HISTOLYTICA: NOT DETECTED
ENTEROPATHOGENIC E COLI (EPEC): NOT DETECTED
ENTEROTOXIGENIC E COLI (ETEC): NOT DETECTED
Enteroaggregative E coli (EAEC): NOT DETECTED
Giardia lamblia: NOT DETECTED
Norovirus GI/GII: NOT DETECTED
PLESIMONAS SHIGELLOIDES: NOT DETECTED
Rotavirus A: NOT DETECTED
SAPOVIRUS (I, II, IV, AND V): NOT DETECTED
Salmonella species: NOT DETECTED
Shiga like toxin producing E coli (STEC): NOT DETECTED
Shigella/Enteroinvasive E coli (EIEC): NOT DETECTED
Vibrio cholerae: NOT DETECTED
Vibrio species: NOT DETECTED
Yersinia enterocolitica: NOT DETECTED

## 2018-06-22 LAB — URINALYSIS, ROUTINE W REFLEX MICROSCOPIC
BILIRUBIN URINE: NEGATIVE
Glucose, UA: 250 mg/dL — AB
Hgb urine dipstick: NEGATIVE
Ketones, ur: NEGATIVE mg/dL
Leukocytes, UA: NEGATIVE
NITRITE: NEGATIVE
Protein, ur: NEGATIVE mg/dL
Specific Gravity, Urine: <1.005 — ABNORMAL LOW (ref 1.005–1.030)
pH: 6.5 (ref 5.0–8.0)

## 2018-06-22 LAB — CLOSTRIDIUM DIFFICILE BY PCR, REFLEXED: Toxigenic C. Difficile by PCR: POSITIVE — AB

## 2018-06-22 LAB — CBC
HCT: 35.6 % — ABNORMAL LOW (ref 36.0–46.0)
Hemoglobin: 12.1 g/dL (ref 12.0–15.0)
MCH: 30.9 pg (ref 26.0–34.0)
MCHC: 34 g/dL (ref 30.0–36.0)
MCV: 90.8 fL (ref 78.0–100.0)
Platelets: 179 10*3/uL (ref 150–400)
RBC: 3.92 MIL/uL (ref 3.87–5.11)
RDW: 12.1 % (ref 11.5–15.5)
WBC: 9.7 10*3/uL (ref 4.0–10.5)

## 2018-06-22 LAB — COMPREHENSIVE METABOLIC PANEL
ALBUMIN: 3.5 g/dL (ref 3.5–5.0)
ALK PHOS: 45 U/L (ref 38–126)
ALT: 14 U/L (ref 0–44)
ANION GAP: 7 (ref 5–15)
AST: 22 U/L (ref 15–41)
BILIRUBIN TOTAL: 0.3 mg/dL (ref 0.3–1.2)
BUN: 7 mg/dL (ref 6–20)
CALCIUM: 8.2 mg/dL — AB (ref 8.9–10.3)
CO2: 24 mmol/L (ref 22–32)
Chloride: 105 mmol/L (ref 98–111)
Creatinine, Ser: 0.72 mg/dL (ref 0.44–1.00)
GFR calc Af Amer: >60 mL/min (ref >60)
GFR calc non Af Amer: >60 mL/min (ref >60)
GLUCOSE: 117 mg/dL — AB (ref 70–99)
Potassium: 3.1 mmol/L — ABNORMAL LOW (ref 3.5–5.1)
Sodium: 136 mmol/L (ref 135–145)
TOTAL PROTEIN: 6.8 g/dL (ref 6.5–8.1)

## 2018-06-22 LAB — C DIFFICILE QUICK SCREEN W PCR REFLEX
C DIFFICLE (CDIFF) ANTIGEN: POSITIVE — AB
C Diff toxin: NEGATIVE

## 2018-06-22 LAB — LIPASE, BLOOD: Lipase: 23 U/L (ref 11–51)

## 2018-06-22 LAB — PREGNANCY, URINE: Preg Test, Ur: NEGATIVE

## 2018-06-22 MED ORDER — AMOXICILLIN 500 MG PO CAPS
500.0000 mg | ORAL_CAPSULE | Freq: Once | ORAL | Status: AC
  Administered 2018-06-22: 500 mg via ORAL
  Filled 2018-06-22: qty 1

## 2018-06-22 MED ORDER — SODIUM CHLORIDE 0.9 % IV BOLUS
1000.0000 mL | Freq: Once | INTRAVENOUS | Status: AC
  Administered 2018-06-22: 1000 mL via INTRAVENOUS

## 2018-06-22 MED ORDER — AMOXICILLIN 500 MG PO CAPS: 500.0000 mg | ORAL_CAPSULE | Freq: Two times a day (BID) | ORAL | 0 refills | Status: DC

## 2018-06-22 MED ORDER — POTASSIUM CHLORIDE ER 20 MEQ PO TBCR: 20.0000 meq | EXTENDED_RELEASE_TABLET | Freq: Two times a day (BID) | ORAL | 0 refills | Status: DC

## 2018-06-22 MED ORDER — IOPAMIDOL (ISOVUE-300) INJECTION 61%
30.0000 mL | Freq: Once | INTRAVENOUS | Status: AC | PRN
  Administered 2018-06-22: 30 mL via ORAL

## 2018-06-22 MED ORDER — ONDANSETRON HCL 4 MG/2ML IJ SOLN
4.0000 mg | Freq: Once | INTRAMUSCULAR | Status: AC
  Administered 2018-06-22: 4 mg via INTRAVENOUS
  Filled 2018-06-22: qty 2

## 2018-06-22 MED ORDER — VANCOMYCIN HCL 125 MG PO CAPS: 125.0000 mg | ORAL_CAPSULE | Freq: Four times a day (QID) | ORAL | 0 refills | Status: DC

## 2018-06-22 MED ORDER — IOPAMIDOL (ISOVUE-300) INJECTION 61%
100.0000 mL | Freq: Once | INTRAVENOUS | Status: DC | PRN
  Administered 2018-06-22: 100 mL via INTRAVENOUS

## 2018-06-22 MED ORDER — DICYCLOMINE HCL 10 MG/ML IM SOLN
20.0000 mg | Freq: Once | INTRAMUSCULAR | Status: AC
  Administered 2018-06-22: 20 mg via INTRAMUSCULAR
  Filled 2018-06-22: qty 2

## 2018-06-22 MED ORDER — POTASSIUM CHLORIDE CRYS ER 20 MEQ PO TBCR
40.0000 meq | EXTENDED_RELEASE_TABLET | Freq: Once | ORAL | Status: AC
  Administered 2018-06-22: 40 meq via ORAL
  Filled 2018-06-22: qty 2

## 2018-06-22 NOTE — Discharge Instructions (Signed)
Keep yourself hydrated.  Take the antibiotics as prescribed and follow-up with your doctor.  You will be called if your stool cultures grow anything in the treatment needs to be changed.  Return to the ED with dizziness, lightheadedness, persistent diarrhea, vomiting or any other concerns.

## 2018-06-22 NOTE — ED Notes (Signed)
Pt lying flat for orthostatic vital signs. 

## 2018-06-22 NOTE — ED Triage Notes (Signed)
Pt reports taking 6 doses of imodium without improvement in diarrhea.

## 2018-06-22 NOTE — Telephone Encounter (Signed)
Please contact patient to arrange ER follow-up with me.

## 2018-06-22 NOTE — Telephone Encounter (Signed)
C dif antigen positive but toxin negative. Given symptoms and presentation, will treat. Stop amoxicillin. Start vancomycin 125 mg PO q6h x10 days. Sent to pharmacy.

## 2018-06-22 NOTE — ED Notes (Signed)
Patient transported to CT 

## 2018-06-22 NOTE — Telephone Encounter (Signed)
TCM ED follow up schedule for 06/28/18  The next available appointment with Darnelle MaffucciMelissa Osullivan,NP

## 2018-06-22 NOTE — ED Triage Notes (Signed)
Pt c/o diarrhea with fever and chills x 3 days.

## 2018-06-22 NOTE — ED Notes (Signed)
NAD at this time. Pt is stable and going home.  

## 2018-06-22 NOTE — Telephone Encounter (Signed)
Pt has an e/d follow up scheduled 06/28/18

## 2018-06-22 NOTE — ED Provider Notes (Signed)
[facility] EMERGENCY DEPARTMENT Provider Note   CSN: [id number] Arrival date & time: 06/22/18  0120     History   Chief Complaint Chief Complaint  Patient presents with  . Diarrhea    HPI Debbie Hammond is a 42 y.o. female.  Patient reports 2-1/2 days of persistent diarrhea despite using Imodium at home x4 today.  Reports she is going to the bathroom every 20 to 30 minutes.  She did have some blood in the diarrhea initially which seemed to have resolved within return again today.  She said chills but no fever.  Nausea but no vomiting.  No sick contacts or out of the country travel.  No suspicious food intake.  She did complete antibiotics for a tooth ache on July 23.  Has some mild abdominal cramping but no significant pain.  Denies any dizziness or lightheadedness.  No chest pain or shortness of breath.  No one else is been sick at home including her husband and children.  The history is provided by the patient.  Diarrhea   Associated symptoms include abdominal pain and chills. Pertinent negatives include no vomiting, no headaches, no arthralgias, no myalgias and no cough.    Past Medical History:  Diagnosis Date  . Allergy   . Asthma     Patient Active Problem List   Diagnosis Date Noted  . Eczema 02/17/2012  . External hemorrhoid 02/16/2012  . Back pain 01/19/2012  . Allergic rhinitis 11/23/2011    History reviewed. No pertinent surgical history.   OB History   None      Home Medications    Prior to Admission medications   Medication Sig Start Date End Date Taking? Authorizing Provider  loperamide (IMODIUM) 2 MG capsule Take by mouth as needed for diarrhea or loose stools.   Yes [provider]  cyclobenzaprine (FLEXERIL) 5 MG tablet Take 1 tablet (5 mg total) by mouth 3 (three) times daily as needed for muscle spasms. 07/24/17   Sandford Craze'Sullivan, Melissa, NP  Etonogestrel [facility](NEXPLANON [place]) Inject into the skin.    [provider]  loratadine  (CLARITIN) 10 MG tablet Take 10 mg by mouth daily.      [provider]  methylPREDNISolone (MEDROL DOSEPAK) 4 MG TBPK tablet Take as directed 07/24/17   Sandford Craze'Sullivan, Melissa, NP    Family History No family history on file.  Social History Social History   Tobacco Use  . Smoking status: Never Smoker  . Smokeless tobacco: Never Used  Substance Use Topics  . Alcohol use: No  . Drug use: No     Allergies   Aspirin   Review of Systems Review of Systems  Constitutional: Positive for activity change, appetite change, chills and fatigue. Negative for fever.  HENT: Negative for congestion and rhinorrhea.   Eyes: Negative for visual disturbance.  Respiratory: Negative for cough, chest tightness and shortness of breath.   Cardiovascular: Negative for chest pain.  Gastrointestinal: Positive for abdominal pain, blood in stool, diarrhea and nausea. Negative for vomiting.  Genitourinary: Negative for dysuria, hematuria, vaginal bleeding and vaginal discharge.  Musculoskeletal: Negative for arthralgias and myalgias.  Skin: Negative for rash.  Neurological: Negative for dizziness, weakness and headaches.    all other systems are negative except as noted in the HPI and PMH.    Physical Exam Updated Vital Signs BP (!) 147/74 (BP Location: Left Arm)   Pulse (!) 108   Temp 99.6 F (37.6 C) (Oral)   Resp 16  Ht 5' 9.25" (1.759 m)   Wt 61.2 kg (135 lb)   SpO2 100%   BMI 19.79 kg/m   Physical Exam  Constitutional: She is oriented to person, place, and time. [name]. No distress.  Moist mucus membranes  HENT:  Head: Normocephalic and atraumatic.  Mouth/Throat: Oropharynx is clear and moist. No oropharyngeal exudate.  Eyes: Pupils are equal, round, and reactive to light. Conjunctivae and EOM are normal.  Neck: Normal range of motion. Neck supple.  No meningismus.  Cardiovascular: Normal rate, regular rhythm, normal heart sounds and  intact distal pulses.  No murmur heard. Pulmonary/Chest: Effort normal and breath sounds normal. No respiratory distress.  Abdominal: Soft. There is tenderness. There is no rebound and no guarding.  Mild diffuse tenderness  Musculoskeletal: Normal range of motion. She exhibits no edema or tenderness.  Neurological: [name], place, and time. No cranial nerve deficit. She exhibits normal muscle tone. Coordination normal.   5/5 strength throughout. CN 2-12 intact.Equal grip strength.   Skin: Skin is warm.  Psychiatric: She has a normal mood and affect. Her behavior is normal.  Nursing note and vitals reviewed.    ED Treatments / Results  Labs (all labs ordered are listed, but only abnormal results are displayed) Labs Reviewed  COMPREHENSIVE METABOLIC PANEL - Abnormal; Notable for the following components:      Result Value   Potassium 3.1 (*)    Glucose, Bld 117 (*)    Calcium 8.2 (*)    All other components within normal limits  CBC - Abnormal; Notable for the following components:   HCT 35.6 (*)    All other components within normal limits  URINALYSIS, ROUTINE W REFLEX MICROSCOPIC - Abnormal; Notable for the following components:   Color, Urine STRAW (*)    Specific Gravity, Urine <1.005 (*)    Glucose, UA 250 (*)    All other components within normal limits  C DIFFICILE QUICK SCREEN W PCR REFLEX  GASTROINTESTINAL PANEL BY PCR, STOOL (REPLACES STOOL CULTURE)  LIPASE, BLOOD  PREGNANCY, URINE    EKG None  Radiology Ct Abdomen Pelvis W Contrast  Result Date: 06/22/2018 CLINICAL DATA:  Acute onset of chills, fever and diarrhea. EXAM: CT ABDOMEN AND PELVIS WITH CONTRAST TECHNIQUE: Multidetector CT imaging of the abdomen and pelvis was performed using the standard protocol following bolus administration of intravenous contrast. CONTRAST:  ISOVUE-300 IOPAMIDOL (ISOVUE-300) INJECTION 61% COMPARISON:  CT of the abdomen and pelvis from 11/14/2015  FINDINGS: Lower chest: The visualized lung bases are grossly clear. The visualized portions of the mediastinum are unremarkable. Hepatobiliary: The liver is unremarkable in appearance. The gallbladder is unremarkable in appearance. The common bile duct remains normal in caliber. Pancreas: The pancreas is within normal limits. Spleen: The spleen is unremarkable in appearance. Adrenals/Urinary Tract: The adrenal glands are unremarkable in appearance. The kidneys are within normal limits. There is no evidence of hydronephrosis. No renal or ureteral stones are identified. No perinephric stranding is seen. Stomach/Bowel: The stomach is unremarkable in appearance. The small bowel is within normal limits. The appendix is normal in caliber, without evidence of appendicitis. Wall thickening is noted along the sigmoid colon, concerning for a mild infectious or inflammatory colitis. Trace associated free fluid is noted. Vascular/Lymphatic: The abdominal aorta is unremarkable in appearance. The inferior vena cava is grossly unremarkable. No retroperitoneal lymphadenopathy is seen. No pelvic sidewall lymphadenopathy is identified. Reproductive: The bladder is mildly distended and grossly unremarkable.  The uterus is grossly unremarkable in appearance. A nonspecific 4.5 cm cystic focus is noted at the right adnexa. The ovaries are otherwise unremarkable. Other: No additional soft tissue abnormalities are seen. Musculoskeletal: No acute osseous abnormalities are identified. Vacuum phenomenon is noted at L5-S1. The visualized musculature is unremarkable in appearance. IMPRESSION: 1. Wall thickening along the sigmoid colon, concerning for a mild infectious or inflammatory colitis. Trace associated free fluid noted. 2. Nonspecific 4.5 cm cystic focus at the right adnexa, most likely physiologic. Pelvic ultrasound could be considered for further evaluation, when and as deemed clinically appropriate. Electronically Signed   By: Roanna Raider M.D.   On: 06/22/2018 06:39    Procedures Procedures (including critical care time)  Medications Ordered in ED Medications  sodium chloride 0.9 % bolus 1,000 mL (has no administration in time range)  dicyclomine (BENTYL) injection 20 mg (has no administration in time range)  ondansetron (ZOFRAN) injection 4 mg (has no administration in time range)     Initial Impression / Assessment and Plan / ED Course  I have reviewed the triage vital signs and the nursing notes.  Pertinent labs & imaging results that were available during my care of the patient were reviewed by me and considered in my medical decision making (see chart for details).    Persistent bloody diarrhea for the past 3 days occurring every 20 to 30 minutes.  No nausea or vomiting.  No fever.  No recent travel.  Did use amoxicillin several weeks ago for a tooth infection.  Mild tachycardia with moist mucous membranes.  Patient given IV hydration.  Labs are reassuring with mild hypokalemia. Stool studies sent including C. Difficile. GI pathogen panel pending.  Discussed with patient that these results will not be available during this ED visit.  Patient tolerating p.o.  Given 2 L of IV fluids with improvement in heart rate.  Risks and benefits of empiric antibiotic treatment d/w Patient.  Patient unable to take bismuth salicylate due to ASA allergy. Mild tachycardia to 100s after 2 L IVF. 3rd liter ordered.  No vomiting. Will treat with amoxicillin. Anticipate discharge home after IVF if HR improved. Dr. Jacqulyn Bath to assume care at shift change.  Final Clinical Impressions(s) / ED Diagnoses   Final diagnoses:  Bloody diarrhea  Hypokalemia    ED Discharge Orders    None       Redmond Whittley, Jeannett Senior, MD 06/22/18 [phone]

## 2018-06-22 NOTE — ED Provider Notes (Signed)
Blood pressure (!) 109/50, pulse (!) 108, temperature 99.9 F (37.7 C), temperature source Oral, resp. rate 16, height 5' 9.25" (1.759 m), weight 61.2 kg (135 lb), SpO2 97 %.  Assuming care from Dr. Manus Gunningancour.  In short, Debbie Hammond is a 42 y.o. female with a chief complaint of Diarrhea .  Refer to the original H&P for additional details.  The current plan of care is to f/u vitals after IVF.  07:57 PM Patient feeling slightly better after IVF. Stool panel and c. Diff pending but will not result while patient is in the ED. Patient to call PCP today and schedule f/u appointment and review stool studies. Advised increased hydration and potassium replacement. CT scan reviewed. No concern clinically that adnexal findings are causing pain symptoms. No TVUS at this time.   At this time, I do not feel there is any life-threatening condition present. I have reviewed and discussed all results (EKG, imaging, lab, urine as appropriate), exam findings with patient. I have reviewed nursing notes and appropriate previous records.  I feel the patient is safe to be discharged home without further emergent workup. Discussed usual and customary return precautions. Patient and family (if present) verbalize understanding and are comfortable with this plan.  Patient will follow-up with their primary care provider. If they do not have a primary care provider, information for follow-up has been provided to them. All questions have been answered.  Alona BeneJoshua Long, MD    Maia PlanLong, Joshua G, MD 06/22/18 0800

## 2018-06-22 NOTE — ED Notes (Signed)
ED Provider at bedside. 

## 2018-06-22 NOTE — ED Notes (Signed)
Pt c/o continued diarrhea despite imodium, has episodes every 15-20 minutes. She denies any abdominal pain, denies n/v. No one else in her household is sick, and everyone has been eating and drinking the same thing. Pt finished amoxicillin over a week ago, no other abx.

## 2018-06-23 ENCOUNTER — Telehealth: Payer: Self-pay | Admitting: *Deleted

## 2018-06-23 NOTE — Telephone Encounter (Signed)
Noted. She is schedule for follow up with me on 06/28/18.

## 2018-06-23 NOTE — Telephone Encounter (Signed)
Patient called because she did not hear from the ER about results.  Advised patient of results via note that was in computer from the ER doc.  She will pickup medications from the pharmacy.    Melissa FYI.  She was positive for C-Diff

## 2018-06-28 ENCOUNTER — Ambulatory Visit: Payer: BLUE CROSS/BLUE SHIELD | Admitting: Family

## 2018-06-28 ENCOUNTER — Ambulatory Visit (INDEPENDENT_AMBULATORY_CARE_PROVIDER_SITE_OTHER): Payer: BLUE CROSS/BLUE SHIELD | Admitting: Family

## 2018-06-28 VITALS — BP 120/81 | HR 72 | Temp 98.3°F | Ht 70.0 in | Wt 135.8 lb

## 2018-06-28 DIAGNOSIS — A0472 Enterocolitis due to Clostridium difficile, not specified as recurrent: Secondary | ICD-10-CM

## 2018-06-28 NOTE — Telephone Encounter (Signed)
Pt. phoned in, stating she needed to cancel her 3PM appointment with Sandford CrazeMelissa O'Sullivan NP today due to a meeting at 230PM per [facility]. Pt. stated she called Friday evening to relay this message, but was not able to speak with anyone, and was generally upset about the timeliness of staff getting back to her about her results. Pt. confirms that she is taking the vancomycin for her c-diff. Author reassured pt. That she would review with Melissa and call pt. back at #[phone] to notify whether or not pt. Will have a no-show fee for today.

## 2018-06-28 NOTE — Progress Notes (Signed)
   Subjective:    Patient ID: Debbie MillardRuth S Hammond, female    DOB: 12/25/1975, 42 y.o.   MRN: [id number]  HPI  Ms Debbie Hammond is a 42 yr old female who presents today for ER follow up. She was seen in the ED on 06/22/18 with chief complaint of diarrhea.  She tested positive for C diff and an rx for vancomycin. Tried immodium and pedialyte without improvement but diarrhea persisted.  Reports that she took augmentin for dental infection.    Reports that she has had BM x 4 today. Was going "non-stop" the day she present to the ER.  Reports symptoms are "a lot better."   Stool is loose, no recent blood.  Some mucous.  Reports that she has had some abdominal cramping.     Review of Systems     Objective:   Physical Exam  Constitutional: [name].  Cardiovascular: Normal rate, regular rhythm and normal heart sounds.  No murmur heard. Pulmonary/Chest: Effort normal and breath sounds normal. No respiratory distress. She has no wheezes.  Abdominal: Soft. Bowel sounds are normal. She exhibits no distension and no mass. There is no tenderness. There is no guarding.  Psychiatric: She has a normal mood and affect. Her behavior is normal. Judgment and thought content normal.          Assessment & Plan:  C diff colitis- Clinically improved. Occurred in setting of antibiotic use.  advised pt to complete vancomycin and call if symptoms worsen or if they do not continue to improve. She verbalizes understanding.    A total of 15 minutes were spent face-to-face with the patient during this encounter and over half of that time was spent on counseling and coordination of care. The patient was counseled on c diff colitis, treatment and disease course.

## 2018-06-28 NOTE — Telephone Encounter (Signed)
OK to waive no show fee.

## 2018-06-28 NOTE — Patient Instructions (Signed)
Continue vancomycin. Let us know if your symptoms worsen or if symptoms do not continue to improve.

## 2018-06-28 NOTE — Telephone Encounter (Signed)
Author phoned pt. back to relay that the no-show fee will be waived. Debbie Hammond and Charise CarwinKim Oliphant in front end made aware.

## 2018-10-12 IMAGING — US US EXTREM LOW VENOUS*R*
1 series · 13 of 24 positions shown · non-contrast
Comparison: None.

CLINICAL DATA: Leg pain



[Series 1: us extrem low venous*right* · 0.06mm/px · 13 of 30 slices shown]
[im 1/30]
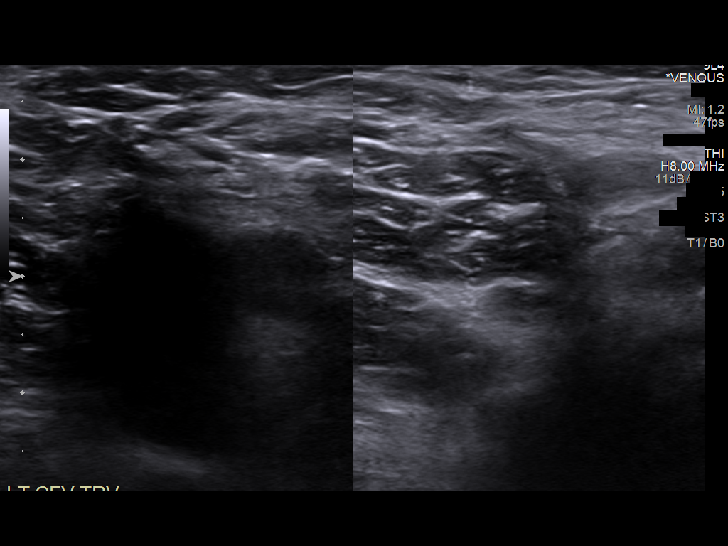
[im 3/30]
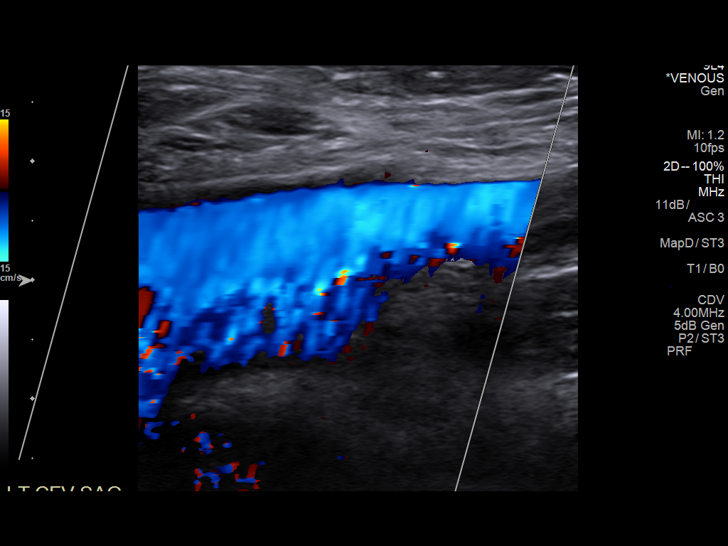
[im 6/30]
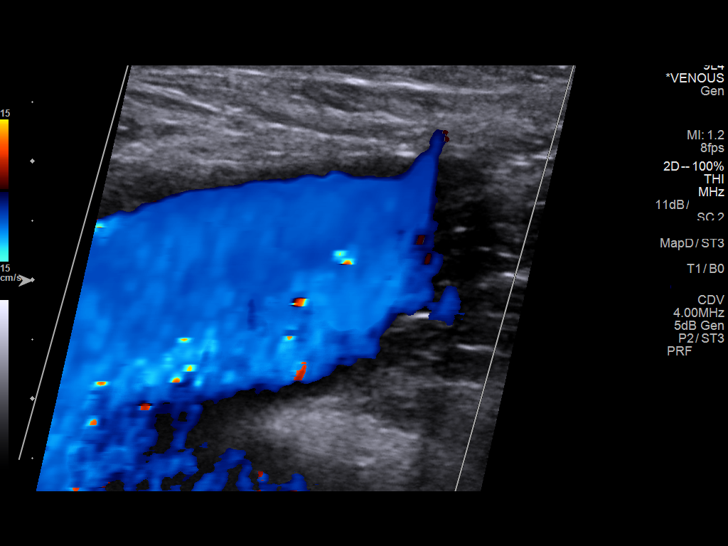
[im 8/30]
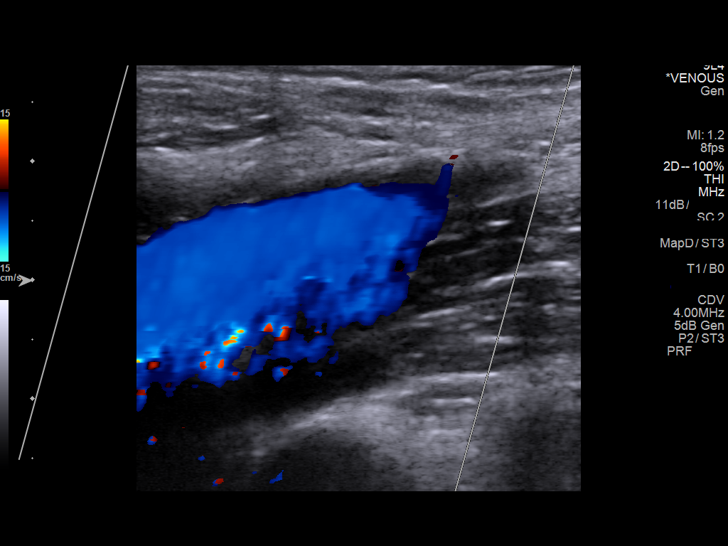
[im 11/30]
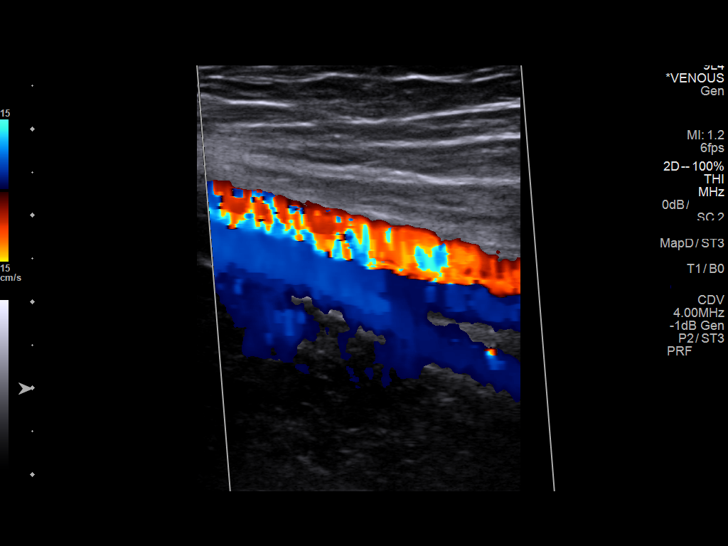
[im 13/30]
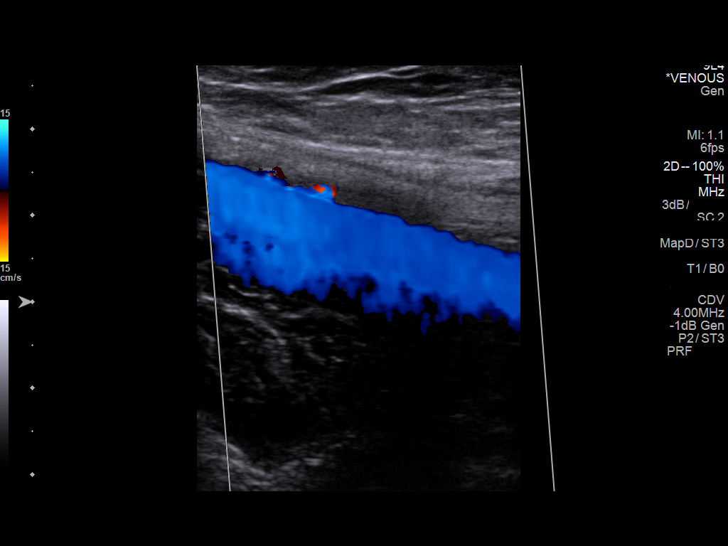
[im 16/30]
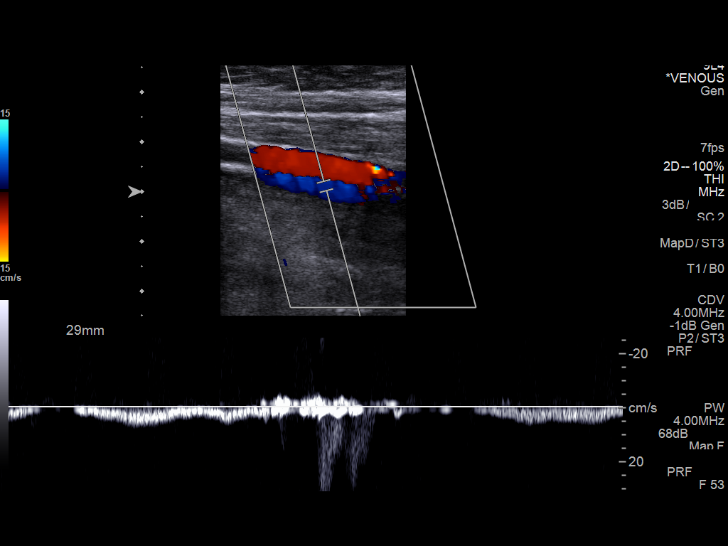
[im 17/30]
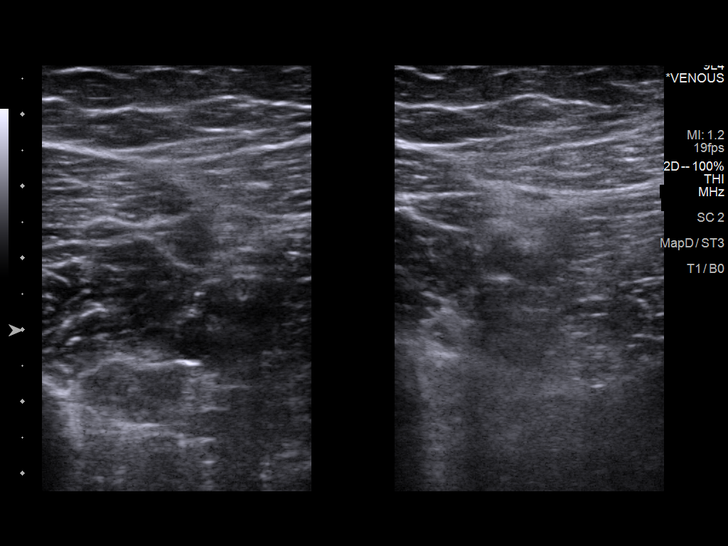
[im 19/30]
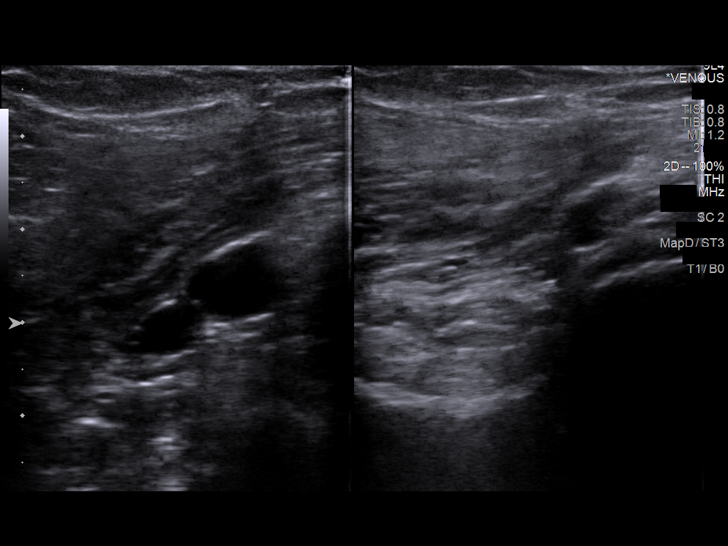
[im 22/30]
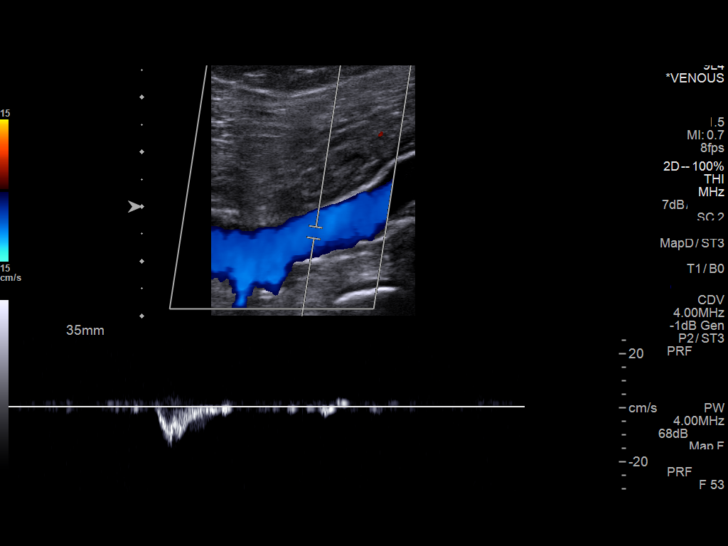
[im 24/30]
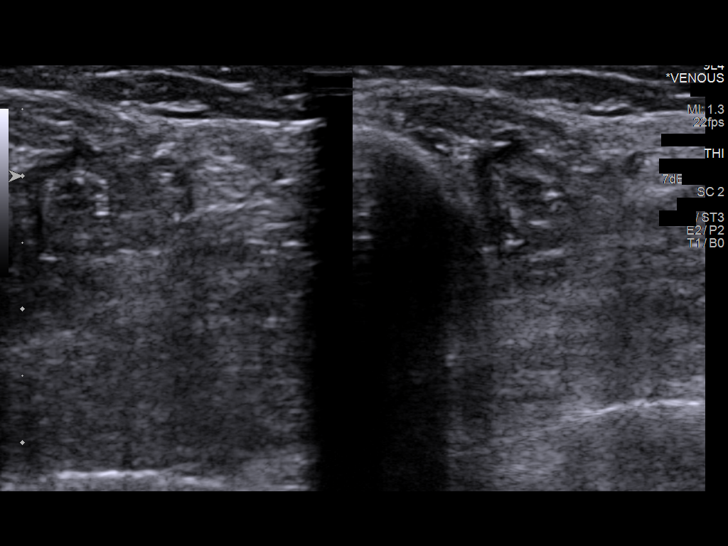
[im 27/30]
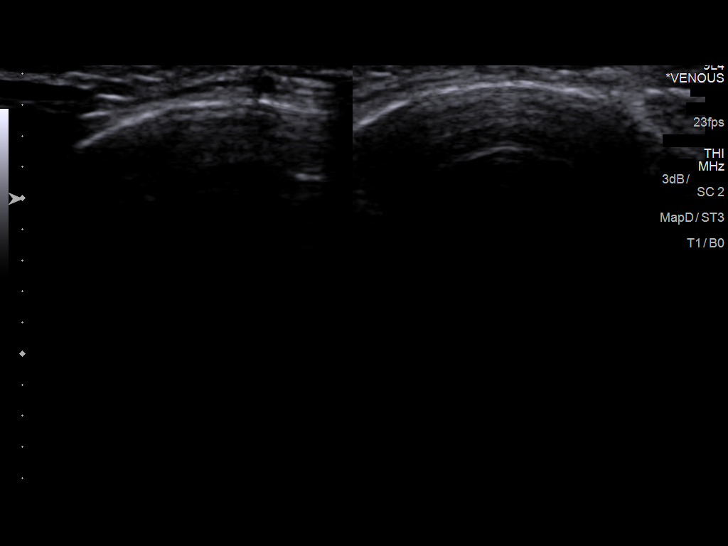
[im 30/30]
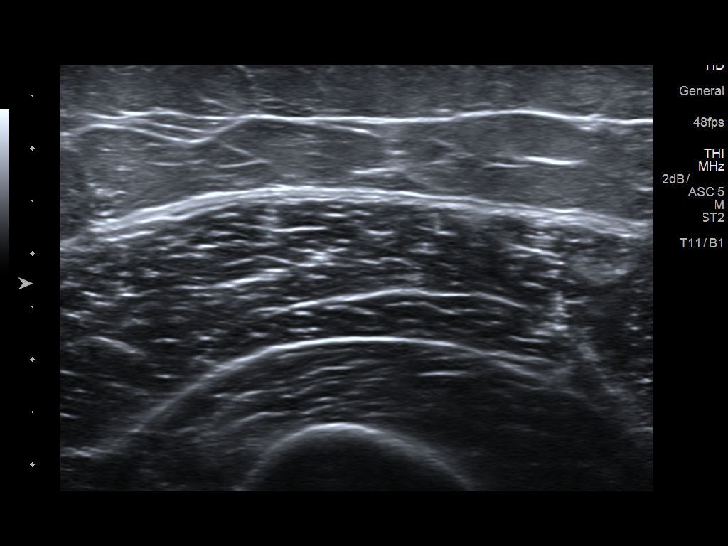

[13 of 24 positions shown; findings below may reference images not displayed]

FINDINGS: Contralateral Common Femoral Vein: Respiratory phasicity is normal
and symmetric with the symptomatic side. No evidence of thrombus.
Normal compressibility.

Common Femoral Vein: No evidence of thrombus. Normal
compressibility, respiratory phasicity and response to augmentation.

Saphenofemoral Junction: No evidence of thrombus. Normal
compressibility and flow on color Doppler imaging.

Profunda Femoral Vein: No evidence of thrombus. Normal
compressibility and flow on color Doppler imaging.

Femoral Vein: No evidence of thrombus. Normal compressibility,
respiratory phasicity and response to augmentation.

Popliteal Vein: No evidence of thrombus. Normal compressibility,
respiratory phasicity and response to augmentation.

Calf Veins: No evidence of thrombus. Normal compressibility and flow
on color Doppler imaging.

Superficial Great Saphenous Vein: No evidence of thrombus. Normal
compressibility and flow on color Doppler imaging.

Venous Reflux:  None.

Other Findings:  None.
IMPRESSION: No evidence of DVT within the right lower extremity.

## 2019-02-25 IMAGING — DX DG PELVIS 1-2V
1 series · 1 of 1 positions shown · non-contrast
Comparison: Coronal and sagittal CT images through the pelvis and
right hip dated November 14, 2015.

CLINICAL DATA: Pain in the upper bilaterally.  No known injury.

EXAM:
PELVIS - 1-2 VIEW

[pelvis ap]
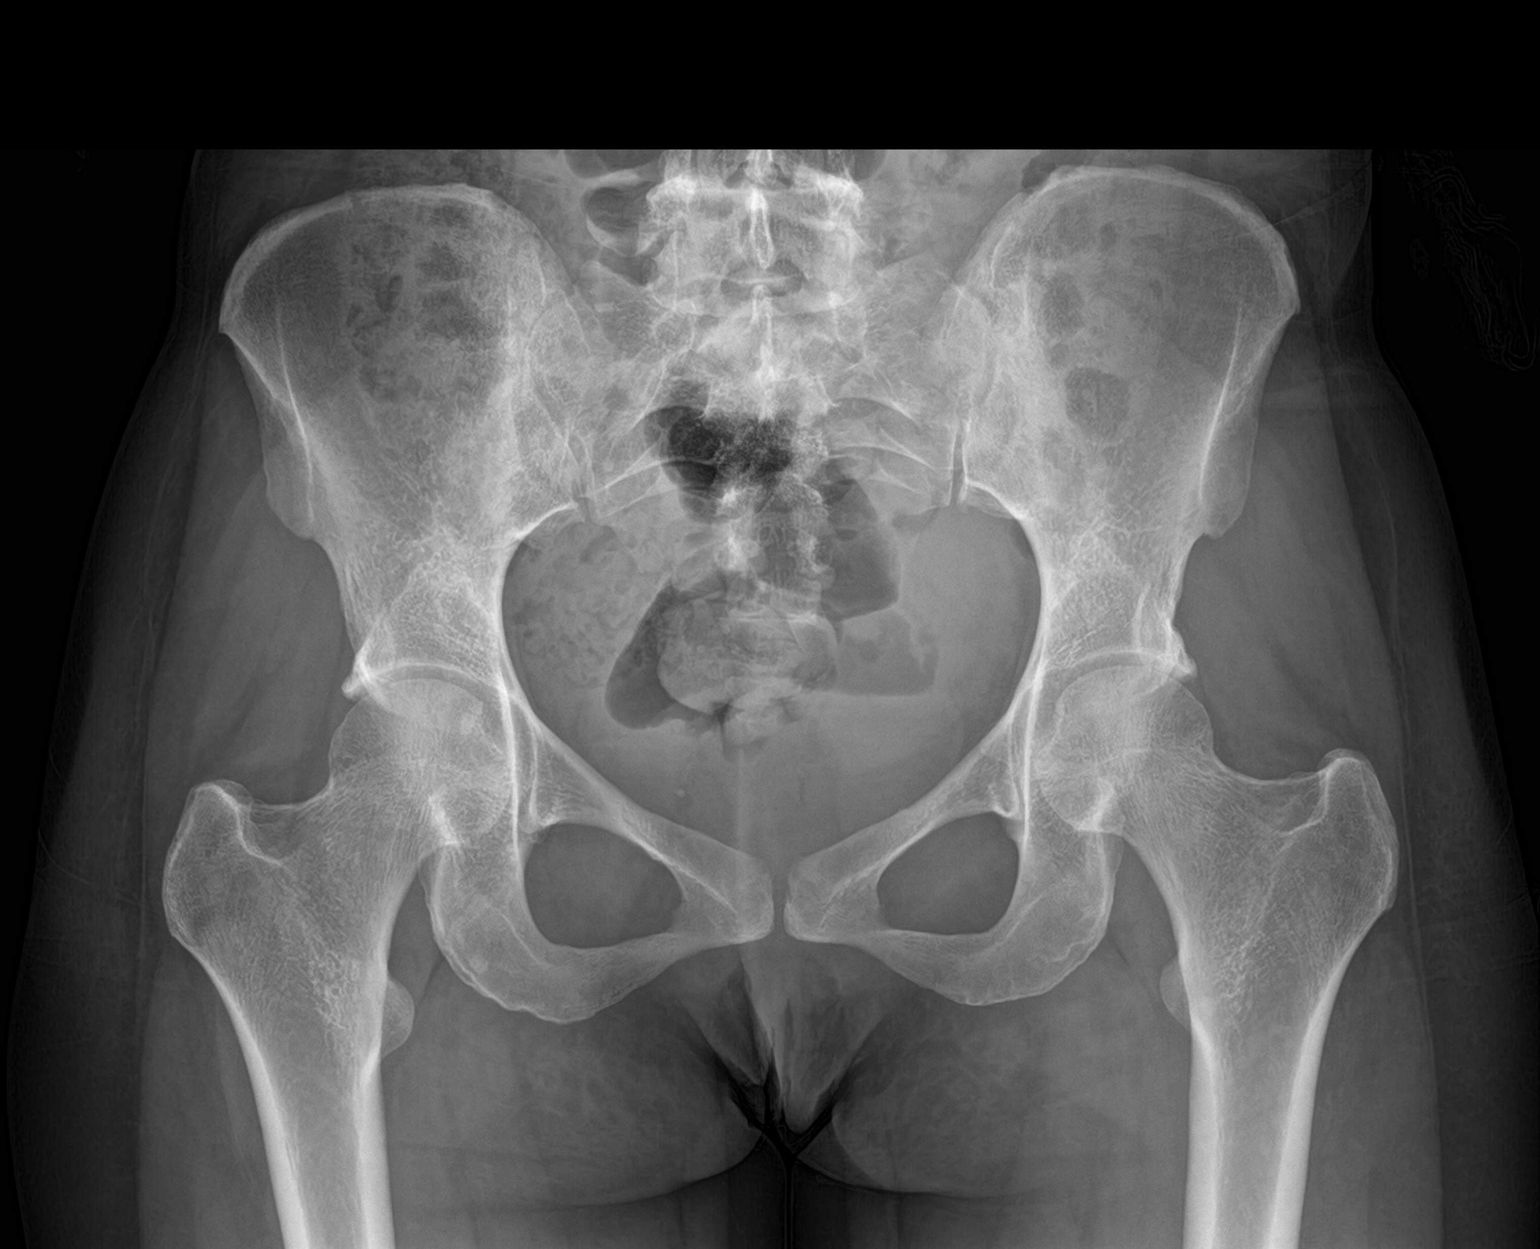

[1 of 1 positions shown; findings below may reference images not displayed]

FINDINGS: The bony pelvis is subjectively adequately mineralized. There is no
lytic or blastic lesion nor acute fracture. The right hip joint
space is preserved. The femoral head and acetabulum remains smoothly
rounded. The femoral neck, intertrochanteric, and immediate sub
trochanteric regions appear normal.
IMPRESSION: There is no acute bony abnormality of the bony pelvis nor visualized
portions of the proximal right femur.

## 2019-02-25 IMAGING — DX DG FEMUR 2+V*R*
4 series · 4 of 4 positions shown · non-contrast
Comparison: AP pelvis of today's date

CLINICAL DATA: Upper lateral right thigh pain without known injury.

EXAM:
RIGHT FEMUR 2 VIEWS

[femur ap (1 of 2)]
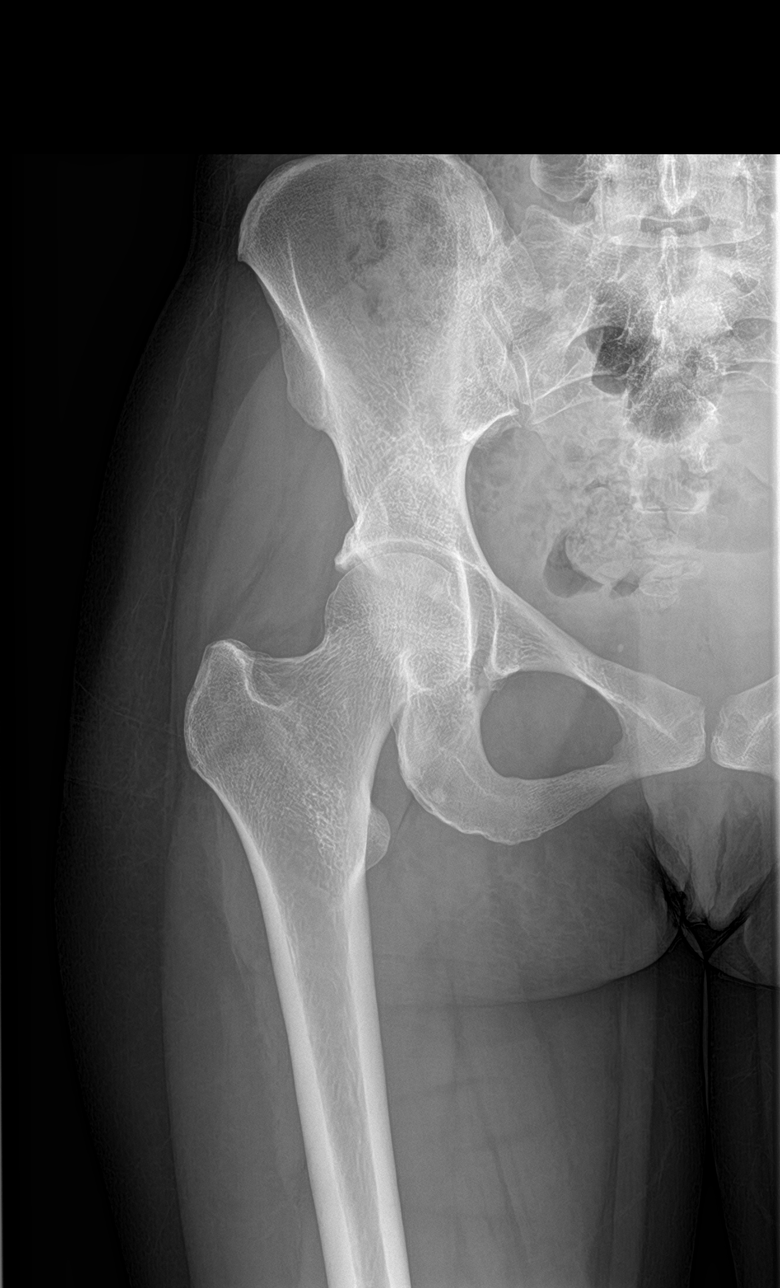

[femur ap (2 of 2)]
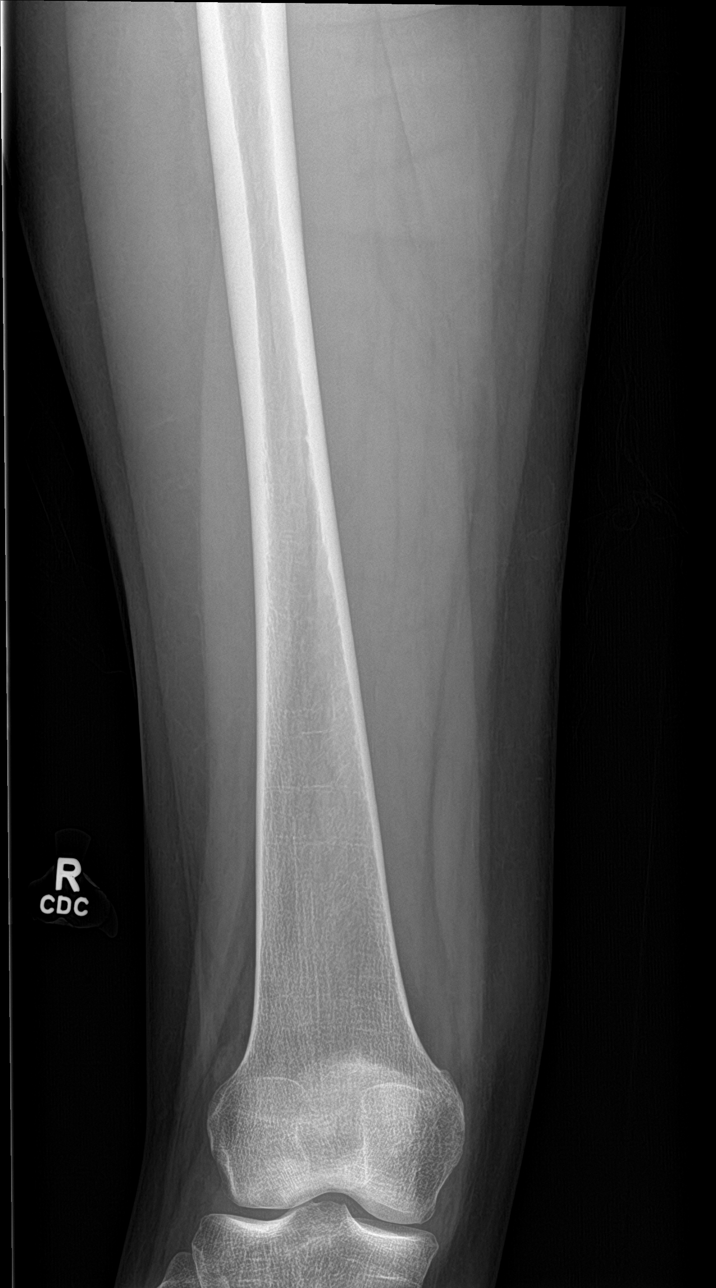

[femur lat (1 of 2)]
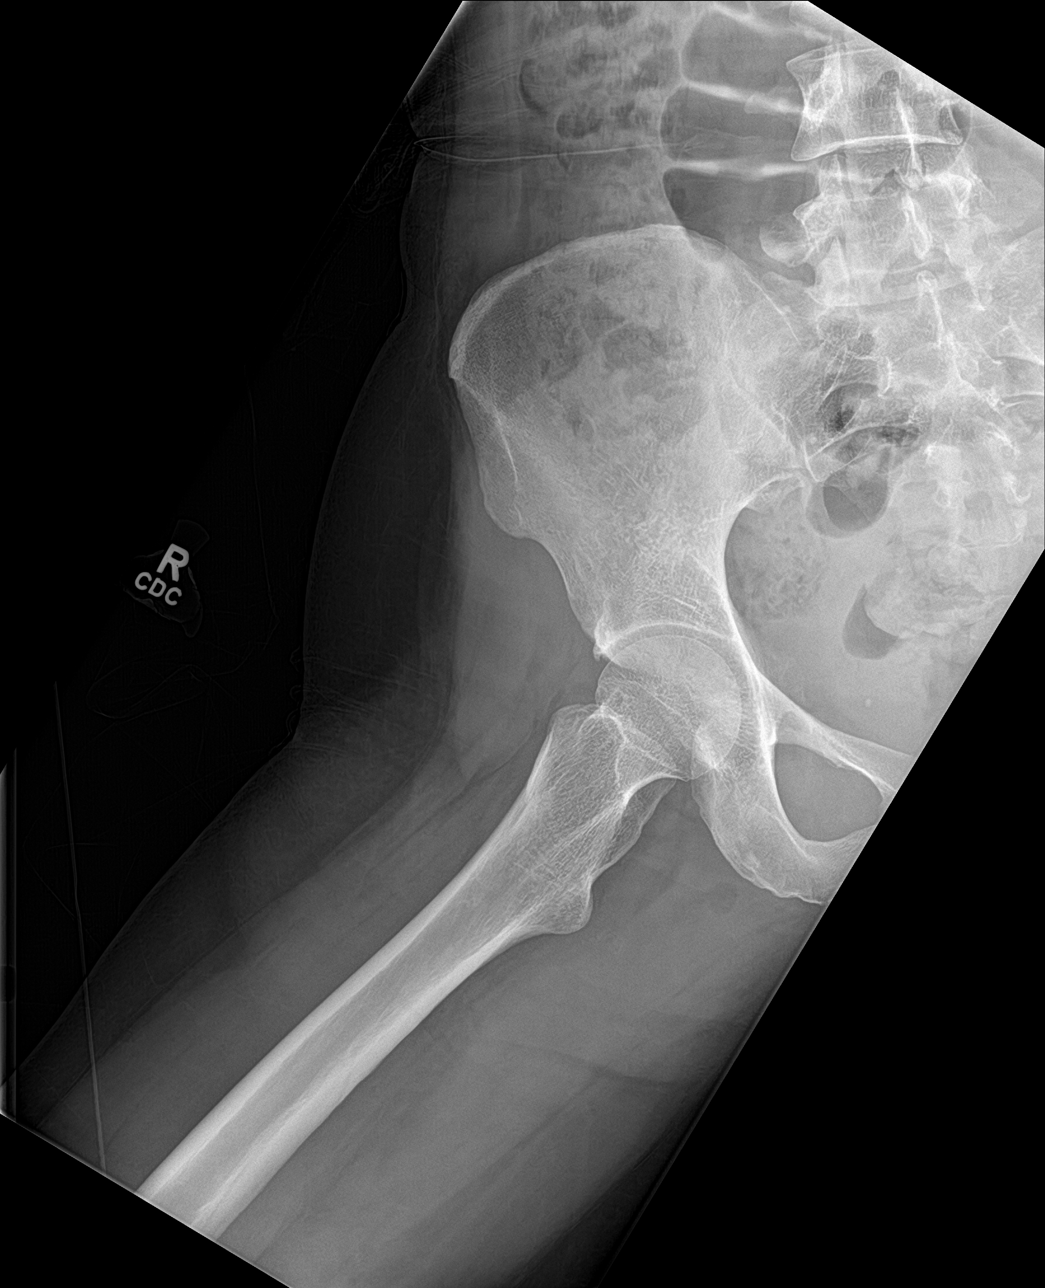

[femur lat (2 of 2)]
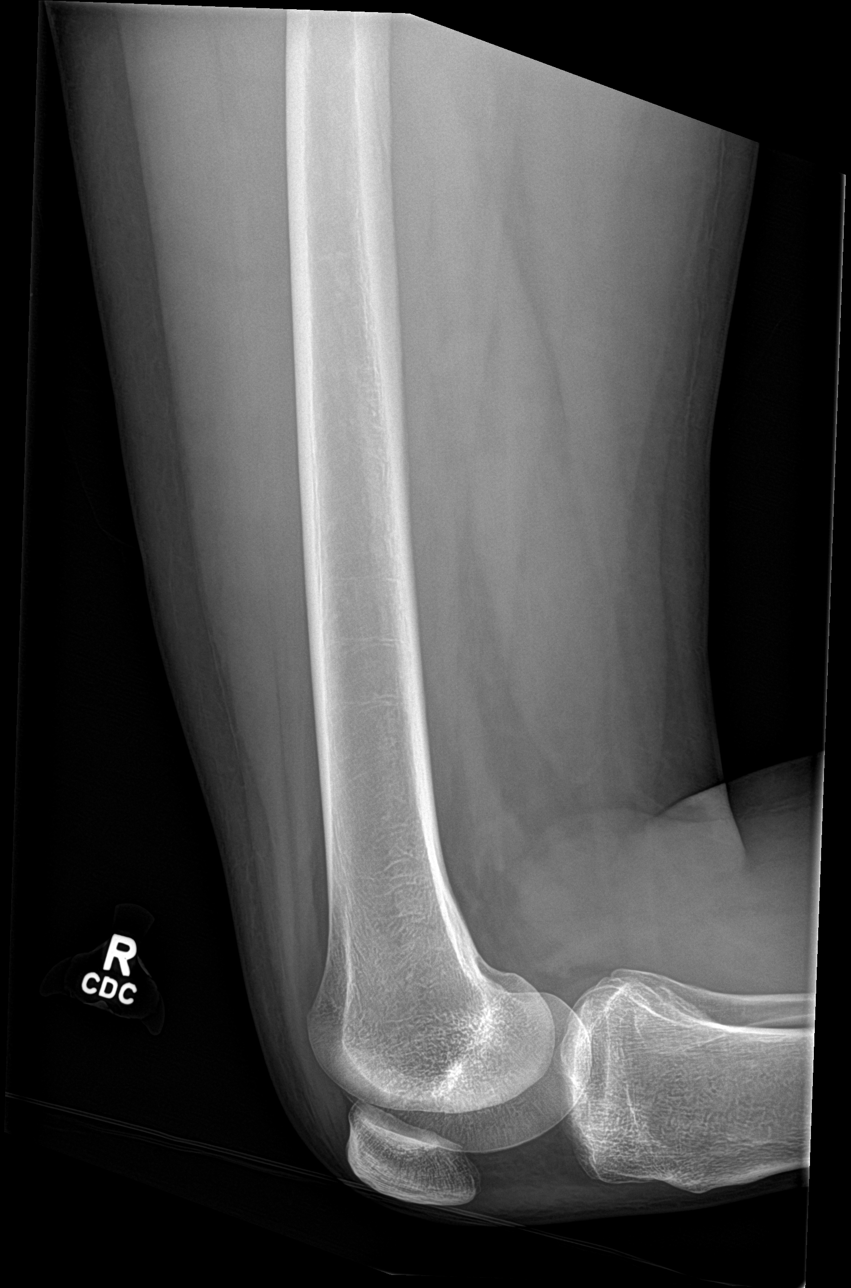

[4 of 4 positions shown; findings below may reference images not displayed]

FINDINGS: The femur is subjectively adequately mineralized. There is no lytic
or blastic lesion or periosteal reaction. No fracture is observed.
The observed portions of the right hip and right knee are normal.
The soft tissues of the thigh exhibit no acute abnormalities.
IMPRESSION: There is no acute bony abnormality of the right femur.

## 2019-07-06 DIAGNOSIS — H04123 Dry eye syndrome of bilateral lacrimal glands: Secondary | ICD-10-CM | POA: Diagnosis not present

## 2019-07-06 DIAGNOSIS — H00022 Hordeolum internum right lower eyelid: Secondary | ICD-10-CM | POA: Diagnosis not present

## 2019-10-06 DIAGNOSIS — Z3046 Encounter for surveillance of implantable subdermal contraceptive: Secondary | ICD-10-CM | POA: Diagnosis not present

## 2019-10-06 DIAGNOSIS — Z681 Body mass index (BMI) 19 or less, adult: Secondary | ICD-10-CM | POA: Diagnosis not present

## 2019-10-06 DIAGNOSIS — Z01419 Encounter for gynecological examination (general) (routine) without abnormal findings: Secondary | ICD-10-CM | POA: Diagnosis not present

## 2019-10-06 DIAGNOSIS — Z1151 Encounter for screening for human papillomavirus (HPV): Secondary | ICD-10-CM | POA: Diagnosis not present

## 2019-10-06 DIAGNOSIS — Z1231 Encounter for screening mammogram for malignant neoplasm of breast: Secondary | ICD-10-CM | POA: Diagnosis not present

## 2019-10-06 LAB — HM MAMMOGRAPHY

## 2020-01-06 DIAGNOSIS — Z131 Encounter for screening for diabetes mellitus: Secondary | ICD-10-CM | POA: Diagnosis not present

## 2020-01-06 DIAGNOSIS — Z13220 Encounter for screening for lipoid disorders: Secondary | ICD-10-CM | POA: Diagnosis not present

## 2020-01-06 DIAGNOSIS — Z1329 Encounter for screening for other suspected endocrine disorder: Secondary | ICD-10-CM | POA: Diagnosis not present

## 2020-01-06 DIAGNOSIS — Z130 Encounter for screening for diseases of the blood and blood-forming organs and certain disorders involving the immune mechanism: Secondary | ICD-10-CM | POA: Diagnosis not present

## 2020-01-06 DIAGNOSIS — Z309 Encounter for contraceptive management, unspecified: Secondary | ICD-10-CM | POA: Diagnosis not present

## 2020-01-06 DIAGNOSIS — Z0000 Encounter for general adult medical examination without abnormal findings: Secondary | ICD-10-CM | POA: Diagnosis not present

## 2020-09-14 ENCOUNTER — Telehealth: Payer: Self-pay

## 2020-09-14 NOTE — Telephone Encounter (Signed)
Ok with me 

## 2020-09-14 NOTE — Telephone Encounter (Signed)
Transfer to another [facility] PCP  Pt is requesting to transfer FROM: Sandford Craze, NP Pt is requesting to transfer TO: Dr. Claiborne Billings Reason for requested transfer: Location - patient moved to [facility] area Best contact number: [phone]

## 2020-09-14 NOTE — Telephone Encounter (Signed)
Is this okay?

## 2020-09-14 NOTE — Telephone Encounter (Signed)
OK with me.

## 2020-09-14 NOTE — Telephone Encounter (Signed)
Please assist with scheduling

## 2020-09-20 NOTE — Telephone Encounter (Signed)
LVM for patient to call [facility] to schedule TOC appt.

## 2020-11-14 DIAGNOSIS — N915 Oligomenorrhea, unspecified: Secondary | ICD-10-CM | POA: Diagnosis not present

## 2020-11-14 DIAGNOSIS — N940 Mittelschmerz: Secondary | ICD-10-CM | POA: Diagnosis not present

## 2021-08-29 DIAGNOSIS — N915 Oligomenorrhea, unspecified: Secondary | ICD-10-CM | POA: Diagnosis not present

## 2021-08-29 DIAGNOSIS — N6314 Unspecified lump in the right breast, lower inner quadrant: Secondary | ICD-10-CM | POA: Diagnosis not present

## 2021-08-30 ENCOUNTER — Ambulatory Visit: Payer: BLUE CROSS/BLUE SHIELD | Admitting: Family

## 2021-09-03 DIAGNOSIS — R922 Inconclusive mammogram: Secondary | ICD-10-CM | POA: Diagnosis not present

## 2021-09-03 DIAGNOSIS — N6001 Solitary cyst of right breast: Secondary | ICD-10-CM | POA: Diagnosis not present

## 2021-09-03 LAB — HM MAMMOGRAPHY

## 2021-09-09 ENCOUNTER — Other Ambulatory Visit: Payer: Self-pay

## 2021-09-09 ENCOUNTER — Encounter: Payer: Self-pay | Admitting: Family Medicine

## 2021-09-09 ENCOUNTER — Ambulatory Visit (INDEPENDENT_AMBULATORY_CARE_PROVIDER_SITE_OTHER): Payer: BC Managed Care – PPO | Admitting: Family Medicine

## 2021-09-09 ENCOUNTER — Encounter: Payer: BC Managed Care – PPO | Admitting: Family Medicine

## 2021-09-09 VITALS — BP 155/83 | HR 82 | Temp 98.4°F | Ht 69.5 in | Wt 137.0 lb

## 2021-09-09 DIAGNOSIS — R030 Elevated blood-pressure reading, without diagnosis of hypertension: Secondary | ICD-10-CM

## 2021-09-09 DIAGNOSIS — N6009 Solitary cyst of unspecified breast: Secondary | ICD-10-CM | POA: Diagnosis not present

## 2021-09-09 DIAGNOSIS — Z7689 Persons encountering health services in other specified circumstances: Secondary | ICD-10-CM | POA: Diagnosis not present

## 2021-09-09 NOTE — Patient Instructions (Addendum)
Great to Meet you today.  I have refilled the medication(s) we provide.   If labs were collected, we will inform you of lab results once received either by echart message or telephone call.   - echart message- for normal results that have been seen by the patient already.   - telephone call: abnormal results or if patient has not viewed results in their echart.  

## 2021-09-09 NOTE — Progress Notes (Signed)
Patient ID: [name], female  DOB: 08-26-1976, 45 y.o.   MRN: [id number] Patient Care Team    Relationship Specialty Notifications Start End  Natalia Leatherwood, DO PCP - General Family Medicine  09/09/21   Nelson Chimes, MD Consulting Physician Ophthalmology  09/11/21   Maxie Better, MD Consulting Physician Obstetrics and Gynecology  09/11/21     Chief Complaint  Patient presents with   Establish Care   Hypertension    Elevated BP    Subjective:  Debbie Hammond is a 45 y.o.  female present for new patient establishment. All past medical history, surgical history, allergies, family history, immunizations, medications and social history were updated in the electronic medical record today. All recent labs, ED visits and hospitalizations within the last year were reviewed.  Patient presents today for new patient establishment with recent history of breast mass.  He had noted breast mass for a few months.  She has been evaluated by her gynecology last week for this issue with a mammogram and an ultrasound.  It is suspected breast mass is a cyst.  She has follow-up with her gynecology scheduled on November 9 concerning breast mass.  Depression screen [facility] 2/9 09/09/2021 07/17/2017  Decreased Interest 0 0  Down, Depressed, Hopeless 0 0  PHQ - 2 Score 0 0  Altered sleeping - 0  Tired, decreased energy - 1  Change in appetite - 0  Feeling bad or failure about yourself  - 0  Trouble concentrating - 0  Moving slowly or fidgety/restless - 0  Suicidal thoughts - 0  PHQ-9 Score - 1   No flowsheet data found.      No flowsheet data found.   Immunization History  Administered Date(s) Administered   Influenza Inj Mdck Quad Pf 10/06/2016   Influenza,inj,Quad PF,6+ Mos 10/23/2015   Influenza-Unspecified 11/17/2012   PFIZER(Purple Top)SARS-COV-2 Vaccination 09/07/2020, 09/27/2020   Tdap 06/17/2010    No results found.  Past Medical History:  Diagnosis Date   Allergy     Asthma    External hemorrhoid 02/16/2012   Allergies  Allergen Reactions   Aspirin Swelling    Facial swelling   History reviewed. No pertinent surgical history. History reviewed. No pertinent family history. Social History   Social History Narrative   Marital status/children/pets: married. Q7Y1   Daughter 2006   Son 2007   Education/employment: Bachelor's degree, works as a Associate Professor:      - wears seatbelt: Yes   Grew up in [place]- moved age 39 (after she got married)    Allergies as of 09/09/2021       Reactions   Aspirin Swelling   Facial swelling        Medication List        Accurate as of September 09, 2021 11:59 PM. If you have any questions, ask your nurse or doctor.          STOP taking these medications    acetaminophen-codeine 300-60 MG tablet Commonly known as: TYLENOL #4 Stopped by: Felix Pacini, DO   NEXPLANON [place] Stopped by: Felix Pacini, DO   Potassium Chloride ER 20 MEQ Tbcr Stopped by: Felix Pacini, DO   vancomycin 125 MG capsule Commonly known as: VANCOCIN Stopped by: Felix Pacini, DO       TAKE these medications    loratadine 10 MG tablet Commonly known as: CLARITIN Take 10 mg by mouth daily.        All  past medical history, surgical history, allergies, family history, immunizations andmedications were updated in the EMR today and reviewed under the history and medication portions of their EMR.    No results found for this or any previous visit (from the past 2160 hour(s)).   ROS: 14 pt review of systems performed and negative (unless mentioned in an HPI)  Objective: BP (!) 155/83   Pulse 82   Temp 98.4 F (36.9 C) (Oral)   Ht 5' 9.5" (1.765 m)   Wt 137 lb (62.1 kg)   LMP 09/07/2021   SpO2 100%   BMI 19.94 kg/m  Gen: Afebrile. No acute distress. Nontoxic in appearance, well-developed, well-nourished, very pleasant female HENT: AT. [place]. .  No cough on exam, no hoarseness on  exam. Eyes:Pupils Equal Round Reactive to light, Extraocular movements intact,  Conjunctiva without redness, discharge or icterus. CV: RRR no murmur, no edema, Chest: CTAB, no wheeze, rhonchi or crackles. . Skin: Warm and well-perfused. Skin intact. Neuro/Msk: Normal gait. PERLA. EOMi. Alert. Oriented x3.   Psych: Normal affect, dress and demeanor. Normal speech. Normal thought content and judgment.   Assessment/plan: Debbie Hammond is a 45 y.o. female present for establish care with new provider. Breast mass/cyst: Continue follow-up with gynecology.  She has an appointment arranged November 9 for recheck with her gynecologist.  Elevated BP without diagnosis of hypertension: Patient has elevated blood pressure on exam today.  Blood pressure has been repeated and results are consistent. She had been upset during check-in process and she is in a hurry today, therefore may be falsely elevated due to stress. She has an upcoming appointment with her gynecology team.  She will be sure to note blood pressure during that visit and if above goal of 135/80 again, she will make an appointment to follow-up for elevated blood pressure.   She will follow-up here yearly for her physical and as needed for acute illnesses.  No follow-ups on file.  No orders of the defined types were placed in this encounter.  No orders of the defined types were placed in this encounter.  Referral Orders  No referral(s) requested today     Note is dictated utilizing voice recognition software. Although note has been proof read prior to signing, occasional typographical errors still can be missed. If any questions arise, please do not hesitate to call for verification.  Electronically signed by: Felix Pacini, DO [facility]

## 2021-09-11 ENCOUNTER — Encounter: Payer: Self-pay | Admitting: Family Medicine

## 2021-09-11 DIAGNOSIS — N6009 Solitary cyst of unspecified breast: Secondary | ICD-10-CM | POA: Insufficient documentation

## 2021-09-11 DIAGNOSIS — R030 Elevated blood-pressure reading, without diagnosis of hypertension: Secondary | ICD-10-CM | POA: Insufficient documentation

## 2021-09-16 ENCOUNTER — Ambulatory Visit: Payer: BLUE CROSS/BLUE SHIELD | Admitting: Family

## 2021-09-25 DIAGNOSIS — Z0000 Encounter for general adult medical examination without abnormal findings: Secondary | ICD-10-CM | POA: Diagnosis not present

## 2021-09-25 DIAGNOSIS — Z131 Encounter for screening for diabetes mellitus: Secondary | ICD-10-CM | POA: Diagnosis not present

## 2021-09-25 DIAGNOSIS — Z13220 Encounter for screening for lipoid disorders: Secondary | ICD-10-CM | POA: Diagnosis not present

## 2021-09-25 DIAGNOSIS — Z01419 Encounter for gynecological examination (general) (routine) without abnormal findings: Secondary | ICD-10-CM | POA: Diagnosis not present

## 2021-09-26 LAB — HM PAP SMEAR: HPV, high-risk: NEGATIVE

## 2021-11-01 ENCOUNTER — Encounter: Payer: BLUE CROSS/BLUE SHIELD | Admitting: Family Medicine

## 2022-03-23 ENCOUNTER — Emergency Department (HOSPITAL_BASED_OUTPATIENT_CLINIC_OR_DEPARTMENT_OTHER)
Admission: EM | Admit: 2022-03-23 | Discharge: 2022-03-23 | Disposition: A | Discharge status: Home / Self Care | Payer: BC Managed Care – PPO | Attending: Emergency Medicine | Admitting: Emergency Medicine

## 2022-03-23 ENCOUNTER — Other Ambulatory Visit: Payer: Self-pay

## 2022-03-23 ENCOUNTER — Encounter (HOSPITAL_BASED_OUTPATIENT_CLINIC_OR_DEPARTMENT_OTHER): Payer: Self-pay | Admitting: Emergency Medicine

## 2022-03-23 ENCOUNTER — Emergency Department (HOSPITAL_BASED_OUTPATIENT_CLINIC_OR_DEPARTMENT_OTHER): Payer: BC Managed Care – PPO

## 2022-03-23 DIAGNOSIS — S0181XA Laceration without foreign body of other part of head, initial encounter: Secondary | ICD-10-CM | POA: Diagnosis not present

## 2022-03-23 DIAGNOSIS — Z23 Encounter for immunization: Secondary | ICD-10-CM | POA: Insufficient documentation

## 2022-03-23 DIAGNOSIS — W01198A Fall on same level from slipping, tripping and stumbling with subsequent striking against other object, initial encounter: Secondary | ICD-10-CM | POA: Insufficient documentation

## 2022-03-23 DIAGNOSIS — J342 Deviated nasal septum: Secondary | ICD-10-CM | POA: Diagnosis not present

## 2022-03-23 DIAGNOSIS — S01511A Laceration without foreign body of lip, initial encounter: Secondary | ICD-10-CM | POA: Insufficient documentation

## 2022-03-23 DIAGNOSIS — S0993XA Unspecified injury of face, initial encounter: Secondary | ICD-10-CM | POA: Diagnosis not present

## 2022-03-23 DIAGNOSIS — W19XXXA Unspecified fall, initial encounter: Secondary | ICD-10-CM

## 2022-03-23 DIAGNOSIS — S01512A Laceration without foreign body of oral cavity, initial encounter: Secondary | ICD-10-CM | POA: Diagnosis not present

## 2022-03-23 MED ORDER — LIDOCAINE HCL (PF) 1 % IJ SOLN
10.0000 mL | Freq: Once | INTRAMUSCULAR | Status: DC
  Filled 2022-03-23: qty 10

## 2022-03-23 MED ORDER — TETANUS-DIPHTH-ACELL PERTUSSIS 5-2.5-18.5 LF-MCG/0.5 IM SUSY
0.5000 mL | PREFILLED_SYRINGE | Freq: Once | INTRAMUSCULAR | Status: AC
  Administered 2022-03-23: 0.5 mL via INTRAMUSCULAR
  Filled 2022-03-23: qty 0.5

## 2022-03-23 NOTE — ED Triage Notes (Signed)
Pt arrives pov, steady gait, endorses mechanical fall. C/o lower mouth pain and lac to chin. Bleeding controlled. ?

## 2022-03-23 NOTE — ED Provider Notes (Signed)
?[facility] EMERGENCY DEPARTMENT ?Provider Note ? ? ?CSN: [id number] ?Arrival date & time: 03/23/22  1656 ? ?  ? ?History ? ?Chief Complaint  ?Patient presents with  ? Fall  ? ? ?Debbie Hammond is a 46 y.o. female who presents to the emergency department complaining of mechanical fall due to slipping on a cardboard prior to arrival.  Patient notes that she hit her head.  Patient has associated lightheadedness and is beginning to slightly resolved at this time.  Has not tried medications for his symptoms.  Denies nausea, vomiting, LOC, dizziness.  Denies anticoagulant use. ? ?The history is provided by the patient. No language interpreter was used.  ? ?  ? ?Home Medications ?Prior to Admission medications   ?Medication Sig Start Date End Date Taking? Authorizing Provider  ?loratadine (CLARITIN) 10 MG tablet Take 10 mg by mouth daily.      [provider]  ?   ? ?Allergies    ?Aspirin   ? ?Review of Systems   ?Review of Systems  ?Gastrointestinal:  Negative for nausea and vomiting.  ?Skin:  Positive for wound.  ?Neurological:  Positive for light-headedness (resolved). Negative for dizziness, syncope and headaches.  ?All other systems reviewed and are negative. ? ?Physical Exam ?Updated Vital Signs ?BP (!) 149/80   Pulse 72   Temp 98.6 ?F (37 ?C)   Resp 20   Ht 5' 9.25" (1.759 m)   Wt 61.7 kg   LMP 03/22/2022   SpO2 100%   BMI 19.94 kg/m?  ?Physical Exam ?Vitals and nursing note reviewed.  ?Constitutional:   ?   General: She is not in acute distress. ?   Appearance: She is not diaphoretic.  ?HENT:  ?   Head: Normocephalic and atraumatic.  ?   Comments: 1 cm v-shaped laceration noted to chin.  ?   Mouth/Throat:  ?   Pharynx: No oropharyngeal exudate.  ?   Comments: Laceration noted to the frenulum in place between gums and lower lip that is gaping.  2 lacerations noted to inner lip that are not through and through. ?Eyes:  ?   General: No scleral icterus. ?   Conjunctiva/sclera: Conjunctivae  normal.  ?Cardiovascular:  ?   Rate and Rhythm: Normal rate and regular rhythm.  ?   Pulses: Normal pulses.  ?   Heart sounds: Normal heart sounds.  ?Pulmonary:  ?   Effort: Pulmonary effort is normal. No respiratory distress.  ?   Breath sounds: Normal breath sounds. No wheezing.  ?Abdominal:  ?   General: Bowel sounds are normal.  ?   Palpations: Abdomen is soft. There is no mass.  ?   Tenderness: There is no abdominal tenderness. There is no guarding or rebound.  ?Musculoskeletal:     ?   General: Normal range of motion.  ?   Cervical back: Normal range of motion and neck supple.  ?   Comments: No spinal tenderness to palpation.  ?Skin: ?   General: Skin is warm and dry.  ?Neurological:  ?   Mental Status: She is alert.  ?Psychiatric:     ?   Behavior: Behavior normal.  ? ? ?ED Results / Procedures / Treatments   ?Labs ?(all labs ordered are listed, but only abnormal results are displayed) ?Labs Reviewed - No data to display ? ?EKG ?None ? ?Radiology ?CT Maxillofacial Wo Contrast ? ?Result Date: 03/23/2022 ?CLINICAL DATA:  Mechanical fall, blunt facial trauma. Mouth pain and Tran laceration. EXAM: CT  MAXILLOFACIAL WITHOUT CONTRAST TECHNIQUE: Multidetector CT imaging of the maxillofacial structures was performed. Multiplanar CT image reconstructions were also generated. RADIATION DOSE REDUCTION: This exam was performed according to the departmental dose-optimization program which includes automated exposure control, adjustment of the mA and/or kV according to patient size and/or use of iterative reconstruction technique. COMPARISON:  None Available. FINDINGS: Osseous: No acute fracture of the nasal bone, zygomatic arches, or mandibles. The temporomandibular joints are congruent. There is minimal leftward nasal septal deviation. Intact maxilla and pterygoid plates. Orbits: No orbital fracture.  No globe injury Sinuses: No sinus fracture or fluid levels. There is trace mucosal thickening in right maxillary sinus. No  mastoid effusion. Soft tissues: Skin defect with air in the subcutaneous soft tissues overlying the midline of the chin, consistent with laceration. No radiopaque foreign body. Limited intracranial: No significant or unexpected finding. IMPRESSION: 1. Skin defect with air in the subcutaneous soft tissues overlying the midline of the chin, consistent with laceration. No radiopaque foreign body. 2. No acute facial bone fracture. Electronically Signed   By: Narda Rutherford M.D.   On: 03/23/2022 18:06   ? ?Procedures ?Marland Kitchen.Laceration Repair ? ?Date/Time: 03/23/2022 7:21 PM ?Performed by: Cyndee Brightly, Anzleigh Slaven A, PA-C ?Authorized by: Chestine Spore A, PA-C  ? ?Consent:  ?  Consent obtained:  Verbal ?  Consent given by:  Patient ?  Risks discussed:  Infection, pain and need for additional repair ?Universal protocol:  ?  Patient identity confirmed:  Verbally with patient and hospital-assigned identification number ?Anesthesia:  ?  Anesthesia method:  Local infiltration ?  Local anesthetic:  Lidocaine 1% w/o epi (0.5 ml) ?Laceration details:  ?  Location:  Mouth ?  Mouth location: frenulum between lower gum line and inner lower lip. ?  Length (cm):  0.5 ?Pre-procedure details:  ?  Preparation:  Patient was prepped and draped in usual sterile fashion ?Exploration:  ?  Hemostasis achieved with:  Direct pressure ?  Imaging outcome: foreign body not noted   ?  Wound exploration: entire depth of wound visualized   ?Treatment:  ?  Area cleansed with:  Saline ?  Amount of cleaning:  Standard ?  Irrigation solution:  Sterile saline ?  Irrigation method:  Syringe ?  Visualized foreign bodies/material removed: no   ?Skin repair:  ?  Repair method:  Sutures ?  Suture size:  6-0 ?  Wound skin closure material used: vicryl. ?  Suture technique:  Simple interrupted ?  Number of sutures:  2 ?Approximation:  ?  Approximation:  Close ?Repair type:  ?  Repair type:  Simple ?Post-procedure details:  ?  Dressing:  Open (no dressing) ?  Procedure  completion:  Tolerated well, no immediate complications ?Marland Kitchen.Laceration Repair ? ?Date/Time: 03/23/2022 9:39 PM ?Performed by: Cyndee Brightly, Roshelle Traub A, PA-C ?Authorized by: Chestine Spore A, PA-C  ? ?Consent:  ?  Consent obtained:  Verbal ?  Consent given by:  Patient ?  Risks discussed:  Infection and pain ?Universal protocol:  ?  Patient identity confirmed:  Verbally with patient and hospital-assigned identification number ?Anesthesia:  ?  Anesthesia method:  Local infiltration ?  Local anesthetic:  Lidocaine 1% w/o epi (2 ml) ?Laceration details:  ?  Location:  Face ?  Face location:  Chin ?  Length (cm):  1 ?Exploration:  ?  Hemostasis achieved with:  Direct pressure ?  Imaging outcome: foreign body not noted   ?  Wound exploration: entire depth of wound visualized   ?Treatment:  ?  Area cleansed with:  Saline ?  Amount of cleaning:  Standard ?  Irrigation solution:  Sterile saline ?  Irrigation method:  Syringe ?Skin repair:  ?  Repair method:  Sutures ?  Suture size:  6-0 ?  Suture material:  Prolene ?  Suture technique:  Simple interrupted ?  Number of sutures:  5 ?Approximation:  ?  Approximation:  Close ?Repair type:  ?  Repair type:  Simple ?Post-procedure details:  ?  Dressing:  Non-adherent dressing and sterile dressing ?  Procedure completion:  Tolerated well, no immediate complications  ? ? ?Medications Ordered in ED ?Medications  ?lidocaine (PF) (XYLOCAINE) 1 % injection 10 mL (has no administration in time range)  ?Tdap (BOOSTRIX) injection 0.5 mL (0.5 mLs Intramuscular Given 03/23/22 1810)  ? ? ?ED Course/ Medical Decision Making/ A&P ?Clinical Course as of 03/23/22 2143  ?Sun Mar 23, 2022  ?1920 2 ml lido wo. 5 6-0 prolene to 1 cm chin lac. 2 6-0 vicryl to inner mouth [SB]  ?  ?Clinical Course User Index ?[SB] Thao Vanover A, PA-C  ? ?                        ?Medical Decision Making ?Amount and/or Complexity of Data Reviewed ?Radiology: ordered. ? ?Risk ?Prescription drug management. ? ? ?Pt with mechanical fall  occurring prior to arrival.  Patient hit her head on the ground causing a laceration to her chin and in her mouth.  Denies LOC, headache, vomiting.  Patient is not on any anticoagulants.  Vital signs stable. On exam, patie

## 2022-03-23 NOTE — ED Notes (Signed)
Noted laceration to Pt. Lower ching and inside the pt. Lower lip from a fall today.  Controlled bleeding noted.  Pt. Does state she is having pain. ?

## 2022-03-23 NOTE — Discharge Instructions (Addendum)
It was a pleasure taking care of you today!  ? ?You have absorbable sutures placed inside your mouth, these do not need to be removed. You may return to urgent care, your primary care provider, or return to the emergency department for suture removal in 5-7 days.  Keep the area clean and dry.  Return to the emergency department if worsening or persistent pain, drainage of wound, increased swelling, or color change to area.  ?

## 2022-04-01 ENCOUNTER — Ambulatory Visit
Admission: EM | Admit: 2022-04-01 | Discharge: 2022-04-01 | Disposition: A | Discharge status: Home / Self Care | Payer: BC Managed Care – PPO

## 2022-04-01 NOTE — ED Triage Notes (Addendum)
Patient presents to the urgent care today for a total of 5 sutures to be removed from her chin. Patient denies having any complications to the area. Skin is well healed with no signs of infection noted. Two sutures in the patients mouth are still visible, no signs of infection to that area. ?

## 2022-04-01 NOTE — Discharge Instructions (Signed)
Please monitor for signs of infection (drainage, odor, redness, and swelling) or any opening/s of the wound. If you notice any of these symptoms or have complications to the area please return for re-evaluation.  

## 2022-04-01 NOTE — ED Notes (Signed)
Five sutures successfully removed from patients chin. No complications noted. ?

## 2022-09-15 ENCOUNTER — Ambulatory Visit (INDEPENDENT_AMBULATORY_CARE_PROVIDER_SITE_OTHER): Payer: BC Managed Care – PPO | Admitting: Family Medicine

## 2022-09-15 ENCOUNTER — Encounter: Payer: Self-pay | Admitting: Family Medicine

## 2022-09-15 VITALS — BP 126/62 | HR 92 | Temp 98.6°F | Ht 69.5 in | Wt 140.4 lb

## 2022-09-15 DIAGNOSIS — E876 Hypokalemia: Secondary | ICD-10-CM | POA: Diagnosis not present

## 2022-09-15 DIAGNOSIS — Z0000 Encounter for general adult medical examination without abnormal findings: Secondary | ICD-10-CM

## 2022-09-15 DIAGNOSIS — Z1211 Encounter for screening for malignant neoplasm of colon: Secondary | ICD-10-CM

## 2022-09-15 DIAGNOSIS — Z1159 Encounter for screening for other viral diseases: Secondary | ICD-10-CM | POA: Diagnosis not present

## 2022-09-15 DIAGNOSIS — Z13220 Encounter for screening for lipoid disorders: Secondary | ICD-10-CM

## 2022-09-15 DIAGNOSIS — Z131 Encounter for screening for diabetes mellitus: Secondary | ICD-10-CM | POA: Diagnosis not present

## 2022-09-15 DIAGNOSIS — Z114 Encounter for screening for human immunodeficiency virus [HIV]: Secondary | ICD-10-CM

## 2022-09-15 DIAGNOSIS — Z130 Encounter for screening for diseases of the blood and blood-forming organs and certain disorders involving the immune mechanism: Secondary | ICD-10-CM | POA: Diagnosis not present

## 2022-09-15 LAB — CBC WITH DIFFERENTIAL/PLATELET
Basophils Absolute: 0 10*3/uL (ref 0.0–0.1)
Basophils Relative: 0.3 % (ref 0.0–3.0)
Eosinophils Absolute: 0.1 10*3/uL (ref 0.0–0.7)
Eosinophils Relative: 2.2 % (ref 0.0–5.0)
HCT: 39.1 % (ref 36.0–46.0)
Hemoglobin: 13 g/dL (ref 12.0–15.0)
Lymphocytes Relative: 35.8 % (ref 12.0–46.0)
Lymphs Abs: 1.7 10*3/uL (ref 0.7–4.0)
MCHC: 33.2 g/dL (ref 30.0–36.0)
MCV: 91.4 fl (ref 78.0–100.0)
Monocytes Absolute: 0.7 10*3/uL (ref 0.1–1.0)
Monocytes Relative: 14.3 % — ABNORMAL HIGH (ref 3.0–12.0)
Neutro Abs: 2.3 10*3/uL (ref 1.4–7.7)
Neutrophils Relative %: 47.4 % (ref 43.0–77.0)
Platelets: 238 10*3/uL (ref 150.0–400.0)
RBC: 4.28 Mil/uL (ref 3.87–5.11)
RDW: 12.7 % (ref 11.5–15.5)
WBC: 4.9 10*3/uL (ref 4.0–10.5)

## 2022-09-15 LAB — HEMOGLOBIN A1C: Hgb A1c MFr Bld: 5.9 % (ref 4.6–6.5)

## 2022-09-15 LAB — COMPREHENSIVE METABOLIC PANEL
ALT: 9 U/L (ref 0–35)
AST: 14 U/L (ref 0–37)
Albumin: 4.2 g/dL (ref 3.5–5.2)
Alkaline Phosphatase: 42 U/L (ref 39–117)
BUN: 13 mg/dL (ref 6–23)
CO2: 26 mEq/L (ref 19–32)
Calcium: 9 mg/dL (ref 8.4–10.5)
Chloride: 104 mEq/L (ref 96–112)
Creatinine, Ser: 0.75 mg/dL (ref 0.40–1.20)
GFR: 96.08 mL/min (ref >60.00)
Glucose, Bld: 86 mg/dL (ref 70–99)
Potassium: 4 mEq/L (ref 3.5–5.1)
Sodium: 137 mEq/L (ref 135–145)
Total Bilirubin: 0.5 mg/dL (ref 0.2–1.2)
Total Protein: 7 g/dL (ref 6.0–8.3)

## 2022-09-15 LAB — LIPID PANEL
Cholesterol: 195 mg/dL (ref 0–200)
HDL: 61 mg/dL (ref >39.00)
LDL Cholesterol: 119 mg/dL — ABNORMAL HIGH (ref 0–99)
NonHDL: 133.54
Total CHOL/HDL Ratio: 3
Triglycerides: 74 mg/dL (ref 0.0–149.0)
VLDL: 14.8 mg/dL (ref 0.0–40.0)

## 2022-09-15 NOTE — Patient Instructions (Addendum)

## 2022-09-15 NOTE — Progress Notes (Signed)
Patient ID: ALEEYAH BENSEN, female  DOB: 18-Jul-1976, 46 y.o.   MRN: [id number] Patient Care Team    Relationship Specialty Notifications Start End  Natalia Leatherwood, DO PCP - General Family Medicine  09/09/21   Nelson Chimes, MD Consulting Physician Ophthalmology  09/11/21   Maxie Better, MD Consulting Physician Obstetrics and Gynecology  09/11/21     Chief Complaint  Patient presents with   Annual Exam    Pt is fasting    Subjective:  PRAJNA VANDERPOOL is a 46 y.o.  Female  present for CPE All past medical history, surgical history, allergies, family history, immunizations, medications and social history were updated in the electronic medical record today. All recent labs, ED visits and hospitalizations within the last year were reviewed.  Health maintenance:  Colonoscopy: no fhx.  Discussed screening today - she elected to complete cologuard.  Mammogram: completed: 09/03/2021, gynecology. Cervical cancer screening: last pap: 2020, at gynecology Immunizations: tdap UTD 2023, Influenza declined  (encouraged yearly) Infectious disease screening: HIV and Hep C - pt would like both screenings today DEXA: Routine screening Patient has a Dental home. Hospitalizations/ED visits: Reviewed      09/09/2021    3:56 PM 07/17/2017   12:27 PM  Depression screen PHQ 2/9  Decreased Interest 0 0  Down, Depressed, Hopeless 0 0  PHQ - 2 Score 0 0  Altered sleeping  0  Tired, decreased energy  1  Change in appetite  0  Feeling bad or failure about yourself   0  Trouble concentrating  0  Moving slowly or fidgety/restless  0  Suicidal thoughts  0  PHQ-9 Score  1       No data to display           Immunization History  Administered Date(s) Administered   Influenza Inj Mdck Quad Pf 10/06/2016   Influenza,inj,Quad PF,6+ Mos 10/23/2015   Influenza-Unspecified 11/17/2012   PFIZER(Purple Top)SARS-COV-2 Vaccination 09/07/2020, 09/27/2020   Tdap 06/17/2010, 03/23/2022      Past Medical History:  Diagnosis Date   Allergy    Asthma    External hemorrhoid 02/16/2012   Allergies  Allergen Reactions   Aspirin Swelling    Facial swelling   History reviewed. No pertinent surgical history. History reviewed. No pertinent family history. Social History   Social History Narrative   Marital status/children/pets: married. [id number]   Daughter 2006   Son 2007   Education/employment: Bachelor's degree, works as a Associate Professor:      - wears seatbelt: Yes   Grew up in [place]- moved age 23 (after she got married)    Allergies as of 09/15/2022       Reactions   Aspirin Swelling   Facial swelling        Medication List        Accurate as of September 15, 2022 10:10 AM. If you have any questions, ask your nurse or doctor.          loratadine 10 MG tablet Commonly known as: CLARITIN Take 10 mg by mouth daily.        All past medical history, surgical history, allergies, family history, immunizations andmedications were updated in the EMR today and reviewed under the history and medication portions of their EMR.     No results found for this or any previous visit (from the past 2160 hour(s)).  No results found.   ROS 14 pt review of systems performed and negative (unless  mentioned in an HPI)  Objective: BP 126/62   Pulse 92   Temp 98.6 F (37 C)   Ht 5' 9.5" (1.765 m)   Wt 140 lb 6.4 oz (63.7 kg)   LMP 09/07/2022   SpO2 98%   BMI 20.44 kg/m  Physical Exam Vitals and nursing note reviewed.  Constitutional:      General: She is not in acute distress.    Appearance: Normal appearance. She is not ill-appearing or toxic-appearing.  HENT:     Head: Normocephalic and atraumatic.     Right Ear: Tympanic membrane, ear canal and external ear normal. There is no impacted cerumen.     Left Ear: Tympanic membrane, ear canal and external ear normal. There is no impacted cerumen.     Nose: No congestion or  rhinorrhea.     Mouth/Throat:     Mouth: Mucous membranes are moist.     Pharynx: Oropharynx is clear. No oropharyngeal exudate or posterior oropharyngeal erythema.  Eyes:     General: No scleral icterus.       Right eye: No discharge.        Left eye: No discharge.     Extraocular Movements: Extraocular movements intact.     Conjunctiva/sclera: Conjunctivae normal.     Pupils: Pupils are equal, round, and reactive to light.  Cardiovascular:     Rate and Rhythm: Normal rate and regular rhythm.     Pulses: Normal pulses.     Heart sounds: Normal heart sounds. No murmur heard.    No friction rub. No gallop.  Pulmonary:     Effort: Pulmonary effort is normal. No respiratory distress.     Breath sounds: Normal breath sounds. No stridor. No wheezing, rhonchi or rales.  Chest:     Chest wall: No tenderness.  Abdominal:     General: Abdomen is flat. Bowel sounds are normal. There is no distension.     Palpations: Abdomen is soft. There is no mass.     Tenderness: There is no abdominal tenderness. There is no right CVA tenderness, left CVA tenderness, guarding or rebound.     Hernia: No hernia is present.  Musculoskeletal:        General: No swelling, tenderness or deformity. Normal range of motion.     Cervical back: Normal range of motion and neck supple. No rigidity or tenderness.     Right lower leg: No edema.     Left lower leg: No edema.  Lymphadenopathy:     Cervical: No cervical adenopathy.  Skin:    General: Skin is warm and dry.     Coloration: Skin is not jaundiced or pale.     Findings: No bruising, erythema, lesion or rash.  Neurological:     General: No focal deficit present.     Mental Status: [name], place, and time. Mental status is at baseline.     Cranial Nerves: No cranial nerve deficit.     Sensory: No sensory deficit.     Motor: No weakness.     Coordination: Coordination normal.     Gait: Gait normal.     Deep Tendon Reflexes:  Reflexes normal.  Psychiatric:        Mood and Affect: Mood normal.        Behavior: Behavior normal.        Thought Content: Thought content normal.        Judgment: Judgment normal.  No results found.  Assessment/plan: JILLIEN YAKEL is a 46 y.o. female present for CPE  Need for hepatitis C screening test - Hepatitis C Antibody Diabetes mellitus screening - Hemoglobin A1c Lipid screening - Lipid panel Screening for deficiency anemia - CBC with Differential/Platelet Hypokalemia - Comprehensive metabolic panel Hypocalcemia - Comprehensive metabolic panel Colon cancer screening - Cologuard Encounter for screening for HIV - HIV antibody (with reflex) Routine general medical examination at a health care facility Colonoscopy: no fhx.  Discussed screening today - she elected to complete cologuard.  Mammogram: completed: 09/03/2021, gynecology. Cervical cancer screening: last pap: 2020, at gynecology Immunizations: tdap UTD 2023, Influenza declined  (encouraged yearly) Infectious disease screening: HIV and Hep C - pt would like both screenings today DEXA: Routine screening Patient was encouraged to exercise greater than 150 minutes a week. Patient was encouraged to choose a diet filled with fresh fruits and vegetables, and lean meats. AVS provided to patient today for education/recommendation on gender specific health and safety maintenance.  Return in about 1 year (around 09/17/2023) for cpe (20 min).    Orders Placed This Encounter  Procedures   CBC with Differential/Platelet   Comprehensive metabolic panel   Hemoglobin A1c   Lipid panel   Cologuard   Hepatitis C Antibody   HIV antibody (with reflex)   No orders of the defined types were placed in this encounter.  Referral Orders  No referral(s) requested today     Electronically signed by: Felix Pacini, DO [facility] [place]

## 2022-09-16 LAB — HIV ANTIBODY (ROUTINE TESTING W REFLEX): HIV 1&2 Ab, 4th Generation: NONREACTIVE

## 2022-09-16 LAB — HEPATITIS C ANTIBODY: Hepatitis C Ab: NONREACTIVE

## 2023-01-20 ENCOUNTER — Telehealth: Payer: Self-pay

## 2023-01-20 NOTE — Telephone Encounter (Signed)
LVM for pt to confirm if cologuard kit received. If not, pt can call [phone] for patient support.

## 2023-05-20 DIAGNOSIS — N644 Mastodynia: Secondary | ICD-10-CM | POA: Diagnosis not present

## 2023-05-20 DIAGNOSIS — R92333 Mammographic heterogeneous density, bilateral breasts: Secondary | ICD-10-CM | POA: Diagnosis not present

## 2023-06-27 ENCOUNTER — Ambulatory Visit
Admission: EM | Admit: 2023-06-27 | Discharge: 2023-06-27 | Disposition: A | Discharge status: Home / Self Care | Payer: BC Managed Care – PPO | Attending: Internal Medicine | Admitting: Internal Medicine

## 2023-06-27 DIAGNOSIS — L309 Dermatitis, unspecified: Secondary | ICD-10-CM | POA: Diagnosis not present

## 2023-06-27 MED ORDER — METHYLPREDNISOLONE 4 MG PO TBPK: ORAL_TABLET | ORAL | 0 refills | Status: DC

## 2023-06-27 MED ORDER — TRIAMCINOLONE ACETONIDE 0.1 % EX CREA: 1.0000 | TOPICAL_CREAM | Freq: Two times a day (BID) | CUTANEOUS | 0 refills | Status: DC

## 2023-06-27 NOTE — ED Provider Notes (Signed)
[facility]    CSN: [id number] Arrival date & time: 06/27/23  2952      History   Chief Complaint Chief Complaint  Patient presents with   Rash    HPI Debbie Hammond is a 47 y.o. female presents for evaluation of a rash.  Patient reports 1 week of a pruritic rash on bilateral arms, chest, abdomen, and legs.  States it began after working in her yard.  Does not recall coming in contact with anything specific or any insect bites.  No fevers or chills.  No tongue/lip/throat swelling, difficulty breathing or swallowing.  No history of eczema or psoriasis.  Rash is worse at night.  She has been using an over-the-counter antifungal cream as well as various lotions without improvement.  No other concerns at this time.   Rash   Past Medical History:  Diagnosis Date   Allergy    Asthma    External hemorrhoid 02/16/2012    Patient Active Problem List   Diagnosis Date Noted   Breast cyst 09/11/2021   Elevated systolic blood pressure reading without diagnosis of hypertension 09/11/2021   Eczema 02/17/2012   Allergic rhinitis 11/23/2011    History reviewed. No pertinent surgical history.  OB History     Gravida  5   Para  2   Term      Preterm      AB  3   Living  2      SAB      IAB      Ectopic      Multiple      Live Births               Home Medications    Prior to Admission medications   Medication Sig Start Date End Date Taking? Authorizing Provider  methylPREDNISolone (MEDROL DOSEPAK) 4 MG TBPK tablet Take as prescribed on package 06/27/23  Yes Radford Pax, NP  triamcinolone cream (KENALOG) 0.1 % Apply 1 Application topically 2 (two) times daily. 06/27/23  Yes Radford Pax, NP  loratadine (CLARITIN) 10 MG tablet Take 10 mg by mouth daily.      [provider]    Family History History reviewed. No pertinent family history.  Social History Social History   Tobacco Use   Smoking status: Never    Passive exposure: Never    Smokeless tobacco: Never  Vaping Use   Vaping status: Never Used  Substance Use Topics   Alcohol use: No   Drug use: Never     Allergies   Aspirin   Review of Systems Review of Systems  Skin:  Positive for rash.     Physical Exam Triage Vital Signs ED Triage Vitals  Encounter Vitals Group     BP 06/27/23 0853 (!) 152/80     Systolic BP Percentile --      Diastolic BP Percentile --      Pulse Rate 06/27/23 0853 69     Resp 06/27/23 0853 18     Temp 06/27/23 0853 98.3 F (36.8 C)     Temp Source 06/27/23 0853 Oral     SpO2 06/27/23 0853 98 %     Weight --      Height --      Head Circumference --      Peak Flow --      Pain Score 06/27/23 0851 0     Pain Loc --      Pain Education --  Exclude from Growth Chart --    No data found.  Updated Vital Signs BP (!) 152/80 (BP Location: Right Arm)   Pulse 69   Temp 98.3 F (36.8 C) (Oral)   Resp 18   LMP 06/15/2023 (Exact Date)   SpO2 98%   Visual Acuity Right Eye Distance:   Left Eye Distance:   Bilateral Distance:    Right Eye Near:   Left Eye Near:    Bilateral Near:     Physical Exam Vitals and nursing note reviewed.  Constitutional:      General: She is not in acute distress.    Appearance: Normal appearance. She is not ill-appearing.  HENT:     Head: Normocephalic and atraumatic.  Eyes:     Pupils: Pupils are equal, round, and reactive to light.  Cardiovascular:     Rate and Rhythm: Normal rate.  Pulmonary:     Effort: Pulmonary effort is normal.  Skin:    General: Skin is warm and dry.     Findings: Rash is not crusting, nodular, purpuric, pustular, scaling, urticarial or vesicular.     Comments: There is a scattered mildly erythematous macular papular rash on bilateral forearms, abdomen, right upper chest, and right leg.  No swelling, drainage, warmth.  Neurological:     General: No focal deficit present.     Mental Status: [name], place, and time.   Psychiatric:        Mood and Affect: Mood normal.        Behavior: Behavior normal.      UC Treatments / Results  Labs (all labs ordered are listed, but only abnormal results are displayed) Labs Reviewed - No data to display  EKG   Radiology No results found.  Procedures Procedures (including critical care time)  Medications Ordered in UC Medications - No data to display  Initial Impression / Assessment and Plan / UC Course  I have reviewed the triage vital signs and the nursing notes.  Pertinent labs & imaging results that were available during my care of the patient were reviewed by me and considered in my medical decision making (see chart for details).     Reviewed exam and symptoms with patient.  No red flags.  Discussed contact dermatitis.  Will start topical Kenalog cream and Medrol Dosepak.  May take OTC Benadryl at night as needed for itching.  PCP follow-up if symptoms or not improving.  ER precautions reviewed and patient verbalized understanding. Final Clinical Impressions(s) / UC Diagnoses   Final diagnoses:  Dermatitis     Discharge Instructions      Start Medrol Dosepak as prescribed.  You may also start topical Kenalog steroid cream to the affected areas twice daily as needed.  You may use Benadryl over-the-counter at night as needed for itching.  Please follow-up with your PCP if your symptoms do not improve.  Please go to the emergency room for any worsening symptoms.  I hope you feel better soon!    ED Prescriptions     Medication Sig Dispense Auth. Provider   methylPREDNISolone (MEDROL DOSEPAK) 4 MG TBPK tablet Take as prescribed on package 21 tablet Radford Pax, NP   triamcinolone cream (KENALOG) 0.1 % Apply 1 Application topically 2 (two) times daily. 30 g Radford Pax, NP      PDMP not reviewed this encounter.   Radford Pax, NP 06/27/23 [phone]

## 2023-06-27 NOTE — ED Triage Notes (Signed)
Pt presents with c/o a generalized rash all over her body x 1 week. Pt states the itching worsens at night. States she has applied anti fungal ointment and it has not helped.

## 2023-06-27 NOTE — Discharge Instructions (Signed)
Start Medrol Dosepak as prescribed.  You may also start topical Kenalog steroid cream to the affected areas twice daily as needed.  You may use Benadryl over-the-counter at night as needed for itching.  Please follow-up with your PCP if your symptoms do not improve.  Please go to the emergency room for any worsening symptoms.  I hope you feel better soon!

## 2023-09-21 ENCOUNTER — Ambulatory Visit (INDEPENDENT_AMBULATORY_CARE_PROVIDER_SITE_OTHER): Payer: BC Managed Care – PPO | Admitting: Family Medicine

## 2023-09-21 ENCOUNTER — Encounter: Payer: Self-pay | Admitting: Family Medicine

## 2023-09-21 VITALS — BP 122/86 | HR 78 | Temp 98.8°F | Ht 69.69 in | Wt 151.2 lb

## 2023-09-21 DIAGNOSIS — Z131 Encounter for screening for diabetes mellitus: Secondary | ICD-10-CM | POA: Diagnosis not present

## 2023-09-21 DIAGNOSIS — Z13220 Encounter for screening for lipoid disorders: Secondary | ICD-10-CM

## 2023-09-21 DIAGNOSIS — Z0000 Encounter for general adult medical examination without abnormal findings: Secondary | ICD-10-CM

## 2023-09-21 DIAGNOSIS — Z1211 Encounter for screening for malignant neoplasm of colon: Secondary | ICD-10-CM

## 2023-09-21 DIAGNOSIS — Z23 Encounter for immunization: Secondary | ICD-10-CM | POA: Diagnosis not present

## 2023-09-21 LAB — TSH: TSH: 1.33 u[IU]/mL (ref 0.35–5.50)

## 2023-09-21 LAB — LIPID PANEL
Cholesterol: 197 mg/dL (ref 0–200)
HDL: 59.9 mg/dL (ref >39.00)
LDL Cholesterol: 120 mg/dL — ABNORMAL HIGH (ref 0–99)
NonHDL: 136.64
Total CHOL/HDL Ratio: 3
Triglycerides: 81 mg/dL (ref 0.0–149.0)
VLDL: 16.2 mg/dL (ref 0.0–40.0)

## 2023-09-21 LAB — CBC WITH DIFFERENTIAL/PLATELET
Basophils Absolute: 0 10*3/uL (ref 0.0–0.1)
Basophils Relative: 0.5 % (ref 0.0–3.0)
Eosinophils Absolute: 0.1 10*3/uL (ref 0.0–0.7)
Eosinophils Relative: 2.6 % (ref 0.0–5.0)
HCT: 39 % (ref 36.0–46.0)
Hemoglobin: 12.4 g/dL (ref 12.0–15.0)
Lymphocytes Relative: 39.9 % (ref 12.0–46.0)
Lymphs Abs: 1.8 10*3/uL (ref 0.7–4.0)
MCHC: 31.8 g/dL (ref 30.0–36.0)
MCV: 91.9 fL (ref 78.0–100.0)
Monocytes Absolute: 0.8 10*3/uL (ref 0.1–1.0)
Monocytes Relative: 17 % — ABNORMAL HIGH (ref 3.0–12.0)
Neutro Abs: 1.8 10*3/uL (ref 1.4–7.7)
Neutrophils Relative %: 40 % — ABNORMAL LOW (ref 43.0–77.0)
Platelets: 275 10*3/uL (ref 150.0–400.0)
RBC: 4.25 Mil/uL (ref 3.87–5.11)
RDW: 13.4 % (ref 11.5–15.5)
WBC: 4.6 10*3/uL (ref 4.0–10.5)

## 2023-09-21 LAB — COMPREHENSIVE METABOLIC PANEL
ALT: 11 U/L (ref 0–35)
AST: 15 U/L (ref 0–37)
Albumin: 4.1 g/dL (ref 3.5–5.2)
Alkaline Phosphatase: 44 U/L (ref 39–117)
BUN: 10 mg/dL (ref 6–23)
CO2: 28 meq/L (ref 19–32)
Calcium: 9.1 mg/dL (ref 8.4–10.5)
Chloride: 103 meq/L (ref 96–112)
Creatinine, Ser: 0.89 mg/dL (ref 0.40–1.20)
GFR: 77.69 mL/min (ref >60.00)
Glucose, Bld: 93 mg/dL (ref 70–99)
Potassium: 4 meq/L (ref 3.5–5.1)
Sodium: 138 meq/L (ref 135–145)
Total Bilirubin: 0.4 mg/dL (ref 0.2–1.2)
Total Protein: 7.3 g/dL (ref 6.0–8.3)

## 2023-09-21 LAB — HEMOGLOBIN A1C: Hgb A1c MFr Bld: 6 % (ref 4.6–6.5)

## 2023-09-21 NOTE — Patient Instructions (Addendum)
Return in about 1 year (around 09/21/2024) for cpe (20 min).        Great to see you today.  I have refilled the medication(s) we provide.   If labs were collected or images ordered, we will inform you of  results once we have received them and reviewed. We will contact you either by echart message, or telephone call.  Please give ample time to the testing facility, and our office to run,  receive and review results. Please do not call inquiring of results, even if you can see them in your chart. We will contact you as soon as we are able. If it has been over 1 week since the test was completed, and you have not yet heard from [place], then please call us.    - echart message- for normal results that have been seen by the patient already.   - telephone call: abnormal results or if patient has not viewed results in their echart.  If a referral to a specialist was entered for you, please call us in 2 weeks if you have not heard from the specialist office to schedule.

## 2023-09-21 NOTE — Progress Notes (Signed)
Patient ID: Debbie Hammond, female  DOB: September 07, 1976, 47 y.o.   MRN: [id number] Patient Care Team    Relationship Specialty Notifications Start End  Natalia Leatherwood, DO PCP - General Family Medicine  09/09/21   Nelson Chimes, MD Consulting Physician Ophthalmology  09/11/21   Maxie Better, MD Consulting Physician Obstetrics and Gynecology  09/11/21     Chief Complaint  Patient presents with   Annual Exam    Pt is fasting; mam and pap requested    Subjective:  Debbie Hammond is a 47 y.o.  Female  present for CPE All past medical history, surgical history, allergies, family history, immunizations, medications and social history were updated in the electronic medical record today. All recent labs, ED visits and hospitalizations within the last year were reviewed.  Health maintenance:  Colonoscopy: no fhx.  Declined screenings.  Mammogram: completed-gynecology.  Records requested Cervical cancer screening: UTD at gynecology-records requested Immunizations: tdap UTD 2023, Influenza declined(encouraged yearly) Infectious disease screening: HIV and Hep C -completed DEXA: Routine screening Patient has a Dental home. Hospitalizations/ED visits: Reviewed      09/21/2023    8:57 AM 09/15/2022    4:16 PM 09/09/2021    3:56 PM 07/17/2017   12:27 PM  Depression screen PHQ 2/9  Decreased Interest 0 0 0 0  Down, Depressed, Hopeless 0 0 0 0  PHQ - 2 Score 0 0 0 0  Altered sleeping  0  0  Tired, decreased energy  1  1  Change in appetite  0  0  Feeling bad or failure about yourself   0  0  Trouble concentrating  0  0  Moving slowly or fidgety/restless  0  0  Suicidal thoughts  0  0  PHQ-9 Score  1  1      09/21/2023    8:57 AM 09/15/2022    4:16 PM  GAD 7 : Generalized Anxiety Score  Nervous, Anxious, on Edge 0 0  Control/stop worrying 0 0  Worry too much - different things 0 0  Trouble relaxing 0 0  Restless 0 0  Easily annoyed or irritable 0 0  Afraid - awful might  happen 0 1  Total GAD 7 Score 0 1  Anxiety Difficulty Not difficult at all      Immunization History  Administered Date(s) Administered   Influenza Inj Mdck Quad Pf 10/06/2016   Influenza,inj,Quad PF,6+ Mos 10/23/2015   Influenza-Unspecified 11/17/2012   PFIZER(Purple Top)SARS-COV-2 Vaccination 09/07/2020, 09/27/2020   Tdap 06/17/2010, 03/23/2022     Past Medical History:  Diagnosis Date   Allergy    Asthma    External hemorrhoid 02/16/2012   Allergies  Allergen Reactions   Aspirin Swelling    Facial swelling   History reviewed. No pertinent surgical history. History reviewed. No pertinent family history. Social History   Social History Narrative   Marital status/children/pets: married. [id number]   Daughter 2006   Son 2007   Education/employment: Bachelor's degree, works as a Associate Professor:      - wears seatbelt: Yes   Grew up in [place]- moved age 89 (after she got married)    Allergies as of 09/21/2023       Reactions   Aspirin Swelling   Facial swelling        Medication List        Accurate as of September 21, 2023  9:13 AM. If you have any questions, ask your nurse or doctor.  STOP taking these medications    methylPREDNISolone 4 MG Tbpk tablet Commonly known as: MEDROL DOSEPAK Stopped by: Felix Pacini   triamcinolone cream 0.1 % Commonly known as: KENALOG Stopped by: Felix Pacini       TAKE these medications    loratadine 10 MG tablet Commonly known as: CLARITIN Take 10 mg by mouth daily.        All past medical history, surgical history, allergies, family history, immunizations andmedications were updated in the EMR today and reviewed under the history and medication portions of their EMR.     No results found for this or any previous visit (from the past 2160 hour(s)).  No results found.   ROS 14 pt review of systems performed and negative (unless mentioned in an HPI)  Objective: BP 122/86    Pulse 78   Temp 98.8 F (37.1 C)   Ht 5' 9.69" (1.77 m)   Wt 151 lb 3.2 oz (68.6 kg)   SpO2 98%   BMI 21.89 kg/m  Physical Exam Vitals and nursing note reviewed.  Constitutional:      General: She is not in acute distress.    Appearance: Normal appearance. She is not ill-appearing or toxic-appearing.  HENT:     Head: Normocephalic and atraumatic.     Right Ear: Tympanic membrane, ear canal and external ear normal. There is no impacted cerumen.     Left Ear: Tympanic membrane, ear canal and external ear normal. There is no impacted cerumen.     Nose: No congestion or rhinorrhea.     Mouth/Throat:     Mouth: Mucous membranes are moist.     Pharynx: Oropharynx is clear. No oropharyngeal exudate or posterior oropharyngeal erythema.  Eyes:     General: No scleral icterus.       Right eye: No discharge.        Left eye: No discharge.     Extraocular Movements: Extraocular movements intact.     Conjunctiva/sclera: Conjunctivae normal.     Pupils: Pupils are equal, round, and reactive to light.  Cardiovascular:     Rate and Rhythm: Normal rate and regular rhythm.     Pulses: Normal pulses.     Heart sounds: Normal heart sounds. No murmur heard.    No friction rub. No gallop.  Pulmonary:     Effort: Pulmonary effort is normal. No respiratory distress.     Breath sounds: Normal breath sounds. No stridor. No wheezing, rhonchi or rales.  Chest:     Chest wall: No tenderness.  Abdominal:     General: Abdomen is flat. Bowel sounds are normal. There is no distension.     Palpations: Abdomen is soft. There is no mass.     Tenderness: There is no abdominal tenderness. There is no right CVA tenderness, left CVA tenderness, guarding or rebound.     Hernia: No hernia is present.  Musculoskeletal:        General: No swelling, tenderness or deformity. Normal range of motion.     Cervical back: Normal range of motion and neck supple. No rigidity or tenderness.     Right lower leg: No edema.      Left lower leg: No edema.  Lymphadenopathy:     Cervical: No cervical adenopathy.  Skin:    General: Skin is warm and dry.     Coloration: Skin is not jaundiced or pale.     Findings: No bruising, erythema, lesion or rash.  Neurological:  General: No focal deficit present.     Mental Status: [name], place, and time. Mental status is at baseline.     Cranial Nerves: No cranial nerve deficit.     Sensory: No sensory deficit.     Motor: No weakness.     Coordination: Coordination normal.     Gait: Gait normal.     Deep Tendon Reflexes: Reflexes normal.  Psychiatric:        Mood and Affect: Mood normal.        Behavior: Behavior normal.        Thought Content: Thought content normal.        Judgment: Judgment normal.      No results found.  Assessment/plan: Debbie Hammond is a 47 y.o. female present for CPE  Routine general medical examination at a health care facility Patient was encouraged to exercise greater than 150 minutes a week. Patient was encouraged to choose a diet filled with fresh fruits and vegetables, and lean meats. AVS provided to patient today for education/recommendation on gender specific health and safety maintenance. Colonoscopy: no fhx.  Declined screenings.  Mammogram: completed-gynecology.  Records requested Cervical cancer screening: UTD at gynecology-records requested Immunizations: tdap UTD 2023, Influenza declined(encouraged yearly) Infectious disease screening: HIV and Hep C -completed DEXA: Routine screening  Return in about 1 year (around 09/21/2024) for cpe (20 min).   Orders Placed This Encounter  Procedures   CBC with Differential/Platelet   Hemoglobin A1c   Comprehensive metabolic panel   Lipid panel   TSH   No orders of the defined types were placed in this encounter.  Referral Orders  No referral(s) requested today     Electronically signed by: Felix Pacini, DO [facility]  [place]

## 2024-04-15 DIAGNOSIS — H524 Presbyopia: Secondary | ICD-10-CM | POA: Diagnosis not present

## 2024-04-15 DIAGNOSIS — H5213 Myopia, bilateral: Secondary | ICD-10-CM | POA: Diagnosis not present

## 2024-04-15 DIAGNOSIS — R519 Headache, unspecified: Secondary | ICD-10-CM | POA: Diagnosis not present

## 2024-04-15 DIAGNOSIS — H52223 Regular astigmatism, bilateral: Secondary | ICD-10-CM | POA: Diagnosis not present

## 2024-04-15 DIAGNOSIS — H04123 Dry eye syndrome of bilateral lacrimal glands: Secondary | ICD-10-CM | POA: Diagnosis not present

## 2024-04-15 DIAGNOSIS — H2513 Age-related nuclear cataract, bilateral: Secondary | ICD-10-CM | POA: Diagnosis not present

## 2024-06-10 DIAGNOSIS — Z1231 Encounter for screening mammogram for malignant neoplasm of breast: Secondary | ICD-10-CM | POA: Diagnosis not present

## 2024-09-23 ENCOUNTER — Encounter: Payer: Self-pay | Admitting: Family Medicine

## 2024-09-23 ENCOUNTER — Ambulatory Visit (INDEPENDENT_AMBULATORY_CARE_PROVIDER_SITE_OTHER): Payer: BC Managed Care – PPO | Admitting: Family Medicine

## 2024-09-23 VITALS — BP 124/80 | HR 89 | Temp 98.5°F | Ht 70.0 in | Wt 150.2 lb

## 2024-09-23 DIAGNOSIS — Z23 Encounter for immunization: Secondary | ICD-10-CM

## 2024-09-23 DIAGNOSIS — Z0000 Encounter for general adult medical examination without abnormal findings: Secondary | ICD-10-CM

## 2024-09-23 DIAGNOSIS — Z131 Encounter for screening for diabetes mellitus: Secondary | ICD-10-CM | POA: Diagnosis not present

## 2024-09-23 DIAGNOSIS — Z1231 Encounter for screening mammogram for malignant neoplasm of breast: Secondary | ICD-10-CM

## 2024-09-23 DIAGNOSIS — Z13220 Encounter for screening for lipoid disorders: Secondary | ICD-10-CM | POA: Diagnosis not present

## 2024-09-23 DIAGNOSIS — Z1211 Encounter for screening for malignant neoplasm of colon: Secondary | ICD-10-CM

## 2024-09-23 LAB — LIPID PANEL
Cholesterol: 215 mg/dL — ABNORMAL HIGH (ref 0–200)
HDL: 58.1 mg/dL (ref >39.00)
LDL Cholesterol: 145 mg/dL — ABNORMAL HIGH (ref 0–99)
NonHDL: 156.64
Total CHOL/HDL Ratio: 4
Triglycerides: 57 mg/dL (ref 0.0–149.0)
VLDL: 11.4 mg/dL (ref 0.0–40.0)

## 2024-09-23 LAB — CBC
HCT: 37.2 % (ref 36.0–46.0)
Hemoglobin: 12.3 g/dL (ref 12.0–15.0)
MCHC: 33.2 g/dL (ref 30.0–36.0)
MCV: 89.1 fl (ref 78.0–100.0)
Platelets: 277 K/uL (ref 150.0–400.0)
RBC: 4.17 Mil/uL (ref 3.87–5.11)
RDW: 13.4 % (ref 11.5–15.5)
WBC: 4.3 K/uL (ref 4.0–10.5)

## 2024-09-23 LAB — COMPREHENSIVE METABOLIC PANEL WITH GFR
ALT: 10 U/L (ref 0–35)
AST: 15 U/L (ref 0–37)
Albumin: 4.3 g/dL (ref 3.5–5.2)
Alkaline Phosphatase: 41 U/L (ref 39–117)
BUN: 11 mg/dL (ref 6–23)
CO2: 28 meq/L (ref 19–32)
Calcium: 8.9 mg/dL (ref 8.4–10.5)
Chloride: 102 meq/L (ref 96–112)
Creatinine, Ser: 0.87 mg/dL (ref 0.40–1.20)
GFR: 79.27 mL/min (ref >60.00)
Glucose, Bld: 83 mg/dL (ref 70–99)
Potassium: 3.9 meq/L (ref 3.5–5.1)
Sodium: 138 meq/L (ref 135–145)
Total Bilirubin: 0.6 mg/dL (ref 0.2–1.2)
Total Protein: 7.3 g/dL (ref 6.0–8.3)

## 2024-09-23 LAB — TSH: TSH: 1.03 u[IU]/mL (ref 0.35–5.50)

## 2024-09-23 LAB — HEMOGLOBIN A1C: Hgb A1c MFr Bld: 6 % (ref 4.6–6.5)

## 2024-09-23 NOTE — Progress Notes (Signed)
 Patient ID: Debbie Hammond, female  DOB: 03/16/1976, 48 y.o.   MRN: [id number] Patient Care Team    Relationship Specialty Notifications Start End  Catherine Charlies LABOR, DO PCP - General Family Medicine  09/09/21   Camillo Golas, MD Consulting Physician Ophthalmology  09/11/21   Rutherford Gain, MD Consulting Physician Obstetrics and Gynecology  09/11/21     Chief Complaint  Patient presents with   Annual Exam    Influenza vaccine- declined Pt is fasting.     Subjective:  Debbie Hammond is a 48 y.o.  Female  present for CPE All past medical history, surgical history, allergies, family history, immunizations, medications and social history were updated in the electronic medical record today. All recent labs, ED visits and hospitalizations within the last year were reviewed.  Health maintenance:  Colon cancer screening: no fhx.  Declined screenings. Mammogram: completed 08/2024-gyn orders had at [facility].  Records requested Cervical cancer screening: UTD 09/2021 at gynecology Immunizations: tdap UTD 2023, Influenza declined (encouraged yearly) Infectious disease screening: HIV and Hep C -completed DEXA: Routine screening Patient has a Dental home. Hospitalizations/ED visits: Reviewed     09/23/2024    8:39 AM 09/21/2023    8:57 AM 09/15/2022    4:16 PM 09/09/2021    3:56 PM 07/17/2017   12:27 PM  Depression screen PHQ 2/9  Decreased Interest 0 0 0 0 0  Down, Depressed, Hopeless 0 0 0 0 0  PHQ - 2 Score 0 0 0 0 0  Altered sleeping 0  0  0  Tired, decreased energy 0  1  1  Change in appetite 0  0  0  Feeling bad or failure about yourself  0  0  0  Trouble concentrating 0  0  0  Moving slowly or fidgety/restless 0  0  0  Suicidal thoughts 0  0  0  PHQ-9 Score 0  1   1   Difficult doing work/chores Not difficult at all         Data saved with a previous flowsheet row definition      09/23/2024    8:39 AM 09/21/2023    8:57 AM 09/15/2022    4:16 PM  GAD 7 : Generalized  Anxiety Score  Nervous, Anxious, on Edge 0 0 0  Control/stop worrying 0 0 0  Worry too much - different things 0 0 0  Trouble relaxing 0 0 0  Restless 0 0 0  Easily annoyed or irritable 0 0 0  Afraid - awful might happen 0 0 1  Total GAD 7 Score 0 0 1  Anxiety Difficulty Not difficult at all Not difficult at all      Immunization History  Administered Date(s) Administered   Influenza Inj Mdck Quad Pf 10/06/2016   Influenza,inj,Quad PF,6+ Mos 10/23/2015   Influenza-Unspecified 11/17/2012   PFIZER(Purple Top)SARS-COV-2 Vaccination 09/07/2020, 09/27/2020   Tdap 06/17/2010, 03/23/2022     Past Medical History:  Diagnosis Date   Allergy    Asthma    External hemorrhoid 02/16/2012   Allergies  Allergen Reactions   Aspirin Swelling    Facial swelling   History reviewed. No pertinent surgical history. History reviewed. No pertinent family history. Social History   Social History Narrative   Marital status/children/pets: married. [id number]   Daughter 2006   Son 2007   Education/employment: Bachelor's degree, works as a Associate Professor:      - wears seatbelt: Yes   Grew  up in [place]- moved age 78 (after she got married)    Allergies as of 09/23/2024       Reactions   Aspirin Swelling   Facial swelling        Medication List        Accurate as of September 23, 2024  9:05 AM. If you have any questions, ask your nurse or doctor.          loratadine 10 MG tablet Commonly known as: CLARITIN Take 10 mg by mouth daily.        All past medical history, surgical history, allergies, family history, immunizations andmedications were updated in the EMR today and reviewed under the history and medication portions of their EMR.     No results found for this or any previous visit (from the past 2160 hours).  No results found.   Review of Systems  All other systems reviewed and are negative.  14 pt review of systems performed and negative  (unless mentioned in an HPI)  Objective: BP 124/80   Pulse 89   Temp 98.5 F (36.9 C)   Ht 5' 10 (1.778 m)   Wt 150 lb 3.2 oz (68.1 kg)   LMP 09/09/2024   SpO2 100%   BMI 21.55 kg/m  Physical Exam Vitals and nursing note reviewed.  Constitutional:      General: She is not in acute distress.    Appearance: Normal appearance. She is not ill-appearing or toxic-appearing.  HENT:     Head: Normocephalic and atraumatic.     Right Ear: Tympanic membrane, ear canal and external ear normal. There is no impacted cerumen.     Left Ear: Tympanic membrane, ear canal and external ear normal. There is no impacted cerumen.     Nose: No congestion or rhinorrhea.     Mouth/Throat:     Mouth: Mucous membranes are moist.     Pharynx: Oropharynx is clear. No oropharyngeal exudate or posterior oropharyngeal erythema.  Eyes:     General: No scleral icterus.       Right eye: No discharge.        Left eye: No discharge.     Extraocular Movements: Extraocular movements intact.     Conjunctiva/sclera: Conjunctivae normal.     Pupils: Pupils are equal, round, and reactive to light.  Cardiovascular:     Rate and Rhythm: Normal rate and regular rhythm.     Pulses: Normal pulses.     Heart sounds: Normal heart sounds. No murmur heard.    No friction rub. No gallop.  Pulmonary:     Effort: Pulmonary effort is normal. No respiratory distress.     Breath sounds: Normal breath sounds. No stridor. No wheezing, rhonchi or rales.  Chest:     Chest wall: No tenderness.  Abdominal:     General: Abdomen is flat. Bowel sounds are normal. There is no distension.     Palpations: Abdomen is soft. There is no mass.     Tenderness: There is no abdominal tenderness. There is no right CVA tenderness, left CVA tenderness, guarding or rebound.     Hernia: No hernia is present.  Musculoskeletal:        General: No swelling, tenderness or deformity. Normal range of motion.     Cervical back: Normal range of motion and  neck supple. No rigidity or tenderness.     Right lower leg: No edema.     Left lower leg: No edema.  Lymphadenopathy:  Cervical: No cervical adenopathy.  Skin:    General: Skin is warm and dry.     Coloration: Skin is not jaundiced or pale.     Findings: No bruising, erythema, lesion or rash.  Neurological:     General: No focal deficit present.     Mental Status: [name], place, and time. Mental status is at baseline.     Cranial Nerves: No cranial nerve deficit.     Sensory: No sensory deficit.     Motor: No weakness.     Coordination: Coordination normal.     Gait: Gait normal.     Deep Tendon Reflexes: Reflexes normal.  Psychiatric:        Mood and Affect: Mood normal.        Behavior: Behavior normal.        Thought Content: Thought content normal.        Judgment: Judgment normal.      No results found.  Assessment/plan: NAILYN DEARINGER is a 48 y.o. female present for CPE  Routine general medical examination at a health care facility Colon cancer screening: no fhx.  Declined screenings. Mammogram: completed 08/2024-gyn orders had at [facility].  Records requested Cervical cancer screening: UTD 09/2021 at gynecology Immunizations: tdap UTD 2023, Influenza declined (encouraged yearly) Infectious disease screening: HIV and Hep C -completed DEXA: Routine screening Patient was encouraged to exercise greater than 150 minutes a week. Patient was encouraged to choose a diet filled with fresh fruits and vegetables, and lean meats. AVS provided to patient today for education/recommendation on gender specific health and safety maintenance.  Influenza vaccine needed declined Lipid screening - Lipid panel Diabetes mellitus screening - Hemoglobin A1c Colon cancer screening decined  Return in about 1 year (around 09/24/2025) for cpe (20 min).   Orders Placed This Encounter  Procedures   CBC   Comprehensive metabolic panel with GFR   Hemoglobin  A1c   Lipid panel   TSH   No orders of the defined types were placed in this encounter.  Referral Orders  No referral(s) requested today     Electronically signed by: Charlies Bellini, DO [facility]

## 2024-09-23 NOTE — Patient Instructions (Addendum)

## 2024-09-26 ENCOUNTER — Ambulatory Visit: Payer: Self-pay | Admitting: Family Medicine

## 2024-12-13 ENCOUNTER — Ambulatory Visit
Admission: EM | Admit: 2024-12-13 | Discharge: 2024-12-13 | Disposition: A | Discharge status: Home / Self Care | Attending: Family Medicine | Admitting: Family Medicine

## 2024-12-13 DIAGNOSIS — R519 Headache, unspecified: Secondary | ICD-10-CM

## 2024-12-13 MED ORDER — NAPROXEN 500 MG PO TABS: 500.0000 mg | ORAL_TABLET | Freq: Two times a day (BID) | ORAL | 0 refills | Status: AC

## 2024-12-13 NOTE — ED Triage Notes (Signed)
 Pt c/o left temporal HA x 5 days-last dose ibuprofen 3 days ago-no pain meds since that time-NAD-steady gait

## 2024-12-13 NOTE — ED Provider Notes (Signed)
 " [facility]  Note:  This document was prepared using Conservation officer, historic buildings and may include unintentional dictation errors.  MRN: [id number] DOB: November 27, 1975  Subjective:   Debbie Hammond is a 49 y.o. female presenting for 5 day history of persistent throbbing temporal headache, pulsating pain with associated left eye pain, radiates to the left lower face. No fever, runny or stuffy nose, ear pain, weakness, vision changes, numbness or tingling, confusion, neck pain, neck stiffness. No history of HTN. No alcohol use. No history of migraines, headaches. Has stressors with work with some added responsibilities but nothing major.  Despite her medication allergies, patient has used ibuprofen and naproxen  in the past without any issues.  Current Outpatient Medications  Medication Instructions   loratadine (CLARITIN) 10 mg, Daily    Allergies[1]  Past Medical History:  Diagnosis Date   Allergy    Asthma    External hemorrhoid 02/16/2012     History reviewed. No pertinent surgical history.  No family history on file.  Social History   Occupational History   Not on file  Tobacco Use   Smoking status: Never    Passive exposure: Never   Smokeless tobacco: Never  Vaping Use   Vaping status: Never Used  Substance and Sexual Activity   Alcohol use: No   Drug use: Never   Sexual activity: Yes    Partners: Male    Birth control/protection: None     ROS   Objective:   Vitals: BP (!) 148/85 (BP Location: Right Arm)   Pulse 83   Temp 98.8 F (37.1 C) (Oral)   Resp 16   LMP 11/26/2024   SpO2 98%   BP Readings from Last 3 Encounters:  12/13/24 (!) 148/85  09/23/24 124/80  09/21/23 122/86   Physical Exam Constitutional:      General: She is not in acute distress.    Appearance: Normal appearance. She is well-developed and normal weight. She is not ill-appearing, toxic-appearing or diaphoretic.  HENT:     Head: Normocephalic and  atraumatic.     Right Ear: Tympanic membrane, ear canal and external ear normal. No drainage or tenderness. No middle ear effusion. There is no impacted cerumen. Tympanic membrane is not erythematous or bulging.     Left Ear: Tympanic membrane, ear canal and external ear normal. No drainage or tenderness.  No middle ear effusion. There is no impacted cerumen. Tympanic membrane is not erythematous or bulging.     Nose: Nose normal. No congestion or rhinorrhea.     Mouth/Throat:     Mouth: Mucous membranes are moist. No oral lesions.     Pharynx: No pharyngeal swelling, oropharyngeal exudate, posterior oropharyngeal erythema or uvula swelling.     Tonsils: No tonsillar exudate or tonsillar abscesses.  Eyes:     General: Lids are normal. Lids are everted, no foreign bodies appreciated. Vision grossly intact. No scleral icterus.       Right eye: No foreign body, discharge or hordeolum.        Left eye: No foreign body, discharge or hordeolum.     Extraocular Movements: Extraocular movements intact.     Right eye: Normal extraocular motion.     Left eye: Normal extraocular motion and no nystagmus.     Conjunctiva/sclera: Conjunctivae normal.     Right eye: Right conjunctiva is not injected. No chemosis, exudate or hemorrhage.    Left eye: Left conjunctiva is not injected. No chemosis, exudate or hemorrhage.  Pupils: Pupils are equal, round, and reactive to light. Pupils are equal.  Neck:     Meningeal: Brudzinski's sign and Kernig's sign absent.  Cardiovascular:     Rate and Rhythm: Normal rate.  Pulmonary:     Effort: Pulmonary effort is normal.  Musculoskeletal:     Cervical back: Normal range of motion and neck supple.  Lymphadenopathy:     Cervical: No cervical adenopathy.  Skin:    General: Skin is warm and dry.  Neurological:     General: No focal deficit present.     Mental Status: [name], place, and time.     Cranial Nerves: No cranial nerve  deficit, dysarthria or facial asymmetry.     Motor: No weakness or pronator drift.     Coordination: Romberg sign negative. Coordination normal. Finger-Nose-Finger Test and Heel to [facility] Test normal. Rapid alternating movements normal.     Gait: Gait and tandem walk normal.     Deep Tendon Reflexes: Reflexes normal.  Psychiatric:        Mood and Affect: Mood normal.        Behavior: Behavior normal.        Thought Content: Thought content normal.        Judgment: Judgment normal.     Assessment and Plan :   PDMP not reviewed this encounter.  1. Left-sided headache      No signs of an acute encephalopathy.  Recommended general headache management, avoidance of triggers.  Use naproxen  for pain and inflammation.  Follow-up with PCP for further testing as deemed appropriate including blood work and/or imaging.  Counseled patient on potential for adverse effects with medications prescribed/recommended today, ER and return-to-clinic precautions discussed, patient verbalized understanding.     [1]  Allergies Allergen Reactions   Aspirin Swelling    Facial swelling     Christopher Savannah, PA-C 12/13/24 1755  "

## 2025-09-29 ENCOUNTER — Encounter: Admitting: Family Medicine
